# Patient Record
Sex: Male | Born: 1935 | Race: White | Hispanic: No | Smoking: Former smoker
Health system: Southern US, Community
[De-identification: ages and names within clinical notes are randomized; demographics above are authoritative.]

## PROBLEM LIST (undated history)

## (undated) DIAGNOSIS — Z8679 Personal history of other diseases of the circulatory system: Secondary | ICD-10-CM

## (undated) DIAGNOSIS — Z8619 Personal history of other infectious and parasitic diseases: Secondary | ICD-10-CM

## (undated) DIAGNOSIS — I4821 Permanent atrial fibrillation: Secondary | ICD-10-CM

## (undated) DIAGNOSIS — K219 Gastro-esophageal reflux disease without esophagitis: Secondary | ICD-10-CM

## (undated) DIAGNOSIS — D649 Anemia, unspecified: Secondary | ICD-10-CM

## (undated) DIAGNOSIS — I447 Left bundle-branch block, unspecified: Secondary | ICD-10-CM

## (undated) DIAGNOSIS — M064 Inflammatory polyarthropathy: Secondary | ICD-10-CM

## (undated) DIAGNOSIS — I10 Essential (primary) hypertension: Secondary | ICD-10-CM

## (undated) DIAGNOSIS — I38 Endocarditis, valve unspecified: Secondary | ICD-10-CM

## (undated) DIAGNOSIS — N189 Chronic kidney disease, unspecified: Secondary | ICD-10-CM

## (undated) DIAGNOSIS — I2510 Atherosclerotic heart disease of native coronary artery without angina pectoris: Secondary | ICD-10-CM

## (undated) DIAGNOSIS — C8510 Unspecified B-cell lymphoma, unspecified site: Secondary | ICD-10-CM

## (undated) DIAGNOSIS — I219 Acute myocardial infarction, unspecified: Secondary | ICD-10-CM

## (undated) DIAGNOSIS — E785 Hyperlipidemia, unspecified: Secondary | ICD-10-CM

## (undated) DIAGNOSIS — M0579 Rheumatoid arthritis with rheumatoid factor of multiple sites without organ or systems involvement: Secondary | ICD-10-CM

## (undated) HISTORY — DX: Rheumatoid arthritis with rheumatoid factor of multiple sites without organ or systems involvement: M05.79

## (undated) HISTORY — DX: Anemia, unspecified: D64.9

## (undated) HISTORY — DX: Atherosclerotic heart disease of native coronary artery without angina pectoris: I25.10

## (undated) HISTORY — DX: Personal history of other infectious and parasitic diseases: Z86.19

## (undated) HISTORY — PX: INGUINAL HERNIA REPAIR: SUR1180

## (undated) HISTORY — DX: Left bundle-branch block, unspecified: I44.7

## (undated) HISTORY — DX: Inflammatory polyarthropathy: M06.4

## (undated) HISTORY — DX: Hyperlipidemia, unspecified: E78.5

## (undated) HISTORY — DX: Essential (primary) hypertension: I10

## (undated) HISTORY — DX: Permanent atrial fibrillation: I48.21

## (undated) HISTORY — DX: Unspecified B-cell lymphoma, unspecified site: C85.10

## (undated) HISTORY — DX: Acute myocardial infarction, unspecified: I21.9

## (undated) HISTORY — DX: Gastro-esophageal reflux disease without esophagitis: K21.9

## (undated) HISTORY — DX: Endocarditis, valve unspecified: I38

## (undated) HISTORY — DX: Personal history of other diseases of the circulatory system: Z86.79

## (undated) HISTORY — DX: Chronic kidney disease, unspecified: N18.9

---

## 1993-01-20 DIAGNOSIS — I219 Acute myocardial infarction, unspecified: Secondary | ICD-10-CM

## 1993-01-20 HISTORY — PX: CORONARY ARTERY BYPASS GRAFT: SHX141

## 1993-01-20 HISTORY — DX: Acute myocardial infarction, unspecified: I21.9

## 2003-01-21 LAB — HM COLONOSCOPY

## 2013-11-20 DIAGNOSIS — M0579 Rheumatoid arthritis with rheumatoid factor of multiple sites without organ or systems involvement: Secondary | ICD-10-CM | POA: Insufficient documentation

## 2013-11-20 HISTORY — DX: Rheumatoid arthritis with rheumatoid factor of multiple sites without organ or systems involvement: M05.79

## 2015-04-30 LAB — HEPATIC FUNCTION PANEL
ALK PHOS: 45 U/L (ref 25–125)
ALT: 10 U/L (ref 10–40)
AST: 14 U/L (ref 14–40)
BILIRUBIN, TOTAL: 0.3 mg/dL

## 2015-04-30 LAB — BASIC METABOLIC PANEL
BUN: 65 mg/dL — AB (ref 4–21)
Creatinine: 3.7 mg/dL — AB (ref 0.6–1.3)
Glucose: 142 mg/dL
Potassium: 5.8 mmol/L — AB (ref 3.4–5.3)
SODIUM: 141 mmol/L (ref 137–147)

## 2015-04-30 LAB — CBC AND DIFFERENTIAL
HEMATOCRIT: 30 % — AB (ref 41–53)
HEMOGLOBIN: 9.4 g/dL — AB (ref 13.5–17.5)
PLATELETS: 264 10*3/uL (ref 150–399)
WBC: 10.2 10*3/mL

## 2015-04-30 LAB — POCT ERYTHROCYTE SEDIMENTATION RATE, NON-AUTOMATED: Sed Rate: 35 mm

## 2015-05-03 ENCOUNTER — Telehealth: Payer: Self-pay | Admitting: General Practice

## 2015-05-03 NOTE — Telephone Encounter (Signed)
Pt's daughter called in because she says that provider agreed to see her father. He is being referred to Dr. Larose Kells by current pt George Noble. Showing that provider doesn't have New Patient appt available until July. Pt has to have coumadin and would need an appt sooner.   Could provider advise for scheduling.   Thanks.    Daughter: Lianne Bushy [id number]

## 2015-05-06 NOTE — Telephone Encounter (Signed)
Please put two 15 minute appointments together and schedule a new pt appointment

## 2015-05-09 NOTE — Telephone Encounter (Signed)
Pt has been scheduled. Thanks!

## 2015-05-10 LAB — HEPATIC FUNCTION PANEL
ALK PHOS: 44 U/L (ref 25–125)
ALT: 8 U/L — AB (ref 10–40)
AST: 20 U/L (ref 14–40)
Bilirubin, Total: 0.5 mg/dL

## 2015-05-10 LAB — BASIC METABOLIC PANEL
BUN: 52 mg/dL — AB (ref 4–21)
CREATININE: 3.5 mg/dL — AB (ref 0.6–1.3)
Glucose: 108 mg/dL
POTASSIUM: 5.2 mmol/L (ref 3.4–5.3)
Sodium: 136 mmol/L — AB (ref 137–147)

## 2015-05-10 LAB — POCT ERYTHROCYTE SEDIMENTATION RATE, NON-AUTOMATED: Sed Rate: 46 mm

## 2015-05-10 LAB — CBC AND DIFFERENTIAL
HCT: 30 % — AB (ref 41–53)
HEMOGLOBIN: 9.6 g/dL — AB (ref 13.5–17.5)
Platelets: 192 10*3/uL (ref 150–399)
WBC: 7.6 10^3/mL

## 2015-06-01 ENCOUNTER — Encounter: Payer: Self-pay | Admitting: *Deleted

## 2015-06-01 ENCOUNTER — Telehealth: Payer: Self-pay | Admitting: *Deleted

## 2015-06-01 NOTE — Addendum Note (Signed)
Addended byDamita Dunnings D on: 06/01/2015 01:50 PM   Modules accepted: Medications

## 2015-06-01 NOTE — Telephone Encounter (Signed)
Received via fax and forwarded to Dr. Gaynelle Arabian. JG//CMA

## 2015-06-01 NOTE — Addendum Note (Signed)
Addended byDamita Dunnings D on: 06/01/2015 12:22 PM   Modules accepted: Medications

## 2015-06-04 ENCOUNTER — Other Ambulatory Visit: Payer: Self-pay

## 2015-06-04 ENCOUNTER — Telehealth: Payer: Self-pay | Admitting: Internal Medicine

## 2015-06-04 NOTE — Telephone Encounter (Signed)
Caller name: Lianne Bushy Relationship to patient: daughter Can be reached: [phone]  Reason for call: She called Dr. Darcella Cheshire office - they state medical records were mailed to Dr. Larose Kells on [place] 05/14/15. Have they been received? She is requesting f/u call.

## 2015-06-04 NOTE — Telephone Encounter (Signed)
Please refer to phone note from 06/01/15. Thanks, JG//CMA

## 2015-06-06 ENCOUNTER — Telehealth: Payer: Self-pay | Admitting: *Deleted

## 2015-06-06 NOTE — Telephone Encounter (Signed)
Unable to reach patient at time of pre-visit call. Left message for patient to return call when available.  

## 2015-06-06 NOTE — Telephone Encounter (Signed)
No other records have been received.

## 2015-06-06 NOTE — Telephone Encounter (Signed)
Daughter George Noble) is interested in knowing if the records from Dr. Darcella Cheshire office has been received. The records from your note on 06/01/15 shows records received from his Rheumatologist not his PCP. Please inform daughter if other records have been received. [phone] is the number she can be reached out.

## 2015-06-07 ENCOUNTER — Telehealth: Payer: Self-pay

## 2015-06-07 ENCOUNTER — Ambulatory Visit (HOSPITAL_BASED_OUTPATIENT_CLINIC_OR_DEPARTMENT_OTHER)
Admission: RE | Admit: 2015-06-07 | Discharge: 2015-06-07 | Disposition: A | Discharge status: Home / Self Care | Payer: Medicare Other | Source: Ambulatory Visit | Attending: Internal Medicine | Admitting: Internal Medicine

## 2015-06-07 ENCOUNTER — Encounter: Payer: Self-pay | Admitting: Internal Medicine

## 2015-06-07 ENCOUNTER — Ambulatory Visit (INDEPENDENT_AMBULATORY_CARE_PROVIDER_SITE_OTHER): Payer: Medicare Other | Admitting: Internal Medicine

## 2015-06-07 VITALS — BP 152/68 | HR 61 | Temp 98.1°F | Ht 72.0 in | Wt 160.2 lb

## 2015-06-07 DIAGNOSIS — T148 Other injury of unspecified body region: Secondary | ICD-10-CM | POA: Diagnosis not present

## 2015-06-07 DIAGNOSIS — M0579 Rheumatoid arthritis with rheumatoid factor of multiple sites without organ or systems involvement: Secondary | ICD-10-CM | POA: Diagnosis not present

## 2015-06-07 DIAGNOSIS — E785 Hyperlipidemia, unspecified: Secondary | ICD-10-CM

## 2015-06-07 DIAGNOSIS — C8580 Other specified types of non-Hodgkin lymphoma, unspecified site: Secondary | ICD-10-CM

## 2015-06-07 DIAGNOSIS — T148XXA Other injury of unspecified body region, initial encounter: Secondary | ICD-10-CM

## 2015-06-07 DIAGNOSIS — M438X4 Other specified deforming dorsopathies, thoracic region: Secondary | ICD-10-CM | POA: Insufficient documentation

## 2015-06-07 DIAGNOSIS — S20219A Contusion of unspecified front wall of thorax, initial encounter: Secondary | ICD-10-CM | POA: Diagnosis present

## 2015-06-07 DIAGNOSIS — N189 Chronic kidney disease, unspecified: Secondary | ICD-10-CM

## 2015-06-07 DIAGNOSIS — D509 Iron deficiency anemia, unspecified: Secondary | ICD-10-CM

## 2015-06-07 DIAGNOSIS — I10 Essential (primary) hypertension: Secondary | ICD-10-CM

## 2015-06-07 DIAGNOSIS — Z09 Encounter for follow-up examination after completed treatment for conditions other than malignant neoplasm: Secondary | ICD-10-CM

## 2015-06-07 DIAGNOSIS — N184 Chronic kidney disease, stage 4 (severe): Secondary | ICD-10-CM | POA: Diagnosis not present

## 2015-06-07 DIAGNOSIS — I482 Chronic atrial fibrillation: Secondary | ICD-10-CM | POA: Diagnosis not present

## 2015-06-07 DIAGNOSIS — X58XXXA Exposure to other specified factors, initial encounter: Secondary | ICD-10-CM | POA: Insufficient documentation

## 2015-06-07 DIAGNOSIS — I4821 Permanent atrial fibrillation: Secondary | ICD-10-CM

## 2015-06-07 DIAGNOSIS — D649 Anemia, unspecified: Secondary | ICD-10-CM

## 2015-06-07 LAB — CBC WITH DIFFERENTIAL/PLATELET
BASOS PCT: 0.3 % (ref 0.0–3.0)
Basophils Absolute: 0 10*3/uL (ref 0.0–0.1)
EOS ABS: 0.1 10*3/uL (ref 0.0–0.7)
Eosinophils Relative: 1 % (ref 0.0–5.0)
HEMATOCRIT: 29 % — AB (ref 39.0–52.0)
HEMOGLOBIN: 9.5 g/dL — AB (ref 13.0–17.0)
Lymphs Abs: 0.4 10*3/uL — ABNORMAL LOW (ref 0.7–4.0)
MCHC: 32.7 g/dL (ref 30.0–36.0)
MCV: 92.4 fl (ref 78.0–100.0)
MONO ABS: 0.7 10*3/uL (ref 0.1–1.0)
Monocytes Relative: 7.2 % (ref 3.0–12.0)
Neutro Abs: 8.6 10*3/uL — ABNORMAL HIGH (ref 1.4–7.7)
Neutrophils Relative %: 87.2 % — ABNORMAL HIGH (ref 43.0–77.0)
Platelets: 176 10*3/uL (ref 150.0–400.0)
RBC: 3.14 Mil/uL — AB (ref 4.22–5.81)
RDW: 16.5 % — ABNORMAL HIGH (ref 11.5–15.5)
WBC: 9.8 10*3/uL (ref 4.0–10.5)

## 2015-06-07 LAB — COMPREHENSIVE METABOLIC PANEL
ALBUMIN: 3.2 g/dL — AB (ref 3.5–5.2)
ALK PHOS: 35 U/L — AB (ref 39–117)
ALT: 13 U/L (ref 0–53)
AST: 14 U/L (ref 0–37)
BUN: 67 mg/dL — AB (ref 6–23)
CALCIUM: 8.3 mg/dL — AB (ref 8.4–10.5)
CO2: 17 mEq/L — ABNORMAL LOW (ref 19–32)
CREATININE: 3.72 mg/dL — AB (ref 0.40–1.50)
Chloride: 113 mEq/L — ABNORMAL HIGH (ref 96–112)
GFR: 16.44 mL/min — ABNORMAL LOW (ref >60.00)
Glucose, Bld: 151 mg/dL — ABNORMAL HIGH (ref 70–99)
Potassium: 4.9 mEq/L (ref 3.5–5.1)
Sodium: 139 mEq/L (ref 135–145)
TOTAL PROTEIN: 5.7 g/dL — AB (ref 6.0–8.3)
Total Bilirubin: 0.7 mg/dL (ref 0.2–1.2)

## 2015-06-07 LAB — POCT INR: INR: 3.8

## 2015-06-07 NOTE — Telephone Encounter (Signed)
Received fax confirmation on 06/07/2015 1307.

## 2015-06-07 NOTE — Telephone Encounter (Signed)
ROI faxed to Dr. Stacy Gardner in [place] at [phone], form sent for scanning.

## 2015-06-07 NOTE — Progress Notes (Signed)
Pre visit review using our clinic review tool, if applicable. No additional management support is needed unless otherwise documented below in the visit note. 

## 2015-06-07 NOTE — Progress Notes (Signed)
Subjective:    Patient ID: George Noble, male    DOB: [birth year], 82 y.o.   MRN: [id number]  DOS:  06/07/2015 Type of visit - description : new pt, here w/ his daughter George Noble Interval history: Pt just moved from [place] to live w/ his daughter , Here to get established. A number of records were reviewed. He was actually doing very well until 3 days ago, he had an accidental fall, landed on his back. Since then is having a significant amount of pain mostly at the left posterior thorax. BP was noted to be elevated on and off at home. BP today also slightly elevated. Due to check an INR   Lexiscan 2011: Very small defect in the anterior septum, likely soft tissue attenuation otherwise negative Echocardiogram 11-11- 2015: Normal LV function EF 60% mild to moderate AoV calcification with mild regurgitation , moderately elevated pulmonary artery systolic pressure >>>see full report  Labs from 2015 Creatinine 2.6, white count 5.8, hemoglobin 10.1, platelets 176  Labs 12/03/2014 White count 8.5, hemoglobin 10.7, platelets 225. Potassium 4.2, creatinine 3.13, phosphorus 3.7. Calcium 8.2  Labs 02/16/2015: White count 10.0, platelets 280, hemoglobin 7.6 Sodium 141, potassium 5.3, creatinine 3.66. Calcium 8.0 slightly low. AST ALT normal. Vitamin D 16: Low. PTH 291, high--- was rx Rocaltrol  Labs 4-10- 2017 Hemoglobin 9.4, white count 10.9, platelets 264, potassium 5.8, creatinine 3.6, LFTs normal CRP 0 .81 DB: Indeterminate.  Note from rheumatology 05/01/2015:  RA initially managed with Arava and low-dose prednisone, Arava d/c after he developed lymphoma. no MTX d/t CKD and  concomitant lymphoma. Rx Rituximab 06-2014, second dose received 1- 2017. Seen 04/10/2015, very active disease, started [facility] 05/10/2015 Hemoglobin 9.6, white count 7.5, platelets 192. Sedimentation rate 46, CRP 2.8 Creatinine 3.48, potassium 5.2, AST ALT normal, alkaline phosphatase 44  TB gold negative  05/10/2015   Review of Systems Denies SS chest pain, difficulty breathing. Does have lower extremity edema, slightly worse than baseline?Marland Kitchen No neck pain per se. No upper or lower extremities paresthesias.  Past Medical History  Diagnosis Date  . Seropositive rheumatoid arthritis of multiple sites ([place]) 11/2013  . Inflammatory polyarthropathy ([place])   . Low grade B-cell lymphoma ([place])   . CAD (coronary artery disease)   . Myocardial infarction [facility]) 1995    inferior, multivessel CABG  . H/O abdominal aortic aneurysm   . Atrial fibrillation, permanent ([place])   . Hyperlipidemia   . Valvular insufficiency   . Complete left bundle branch block (LBBB)     on EKG  . GERD (gastroesophageal reflux disease)   . Anemia   . Hypertension   . History of chicken pox   . Chronic kidney disease     Past Surgical History  Procedure Laterality Date  . Coronary artery bypass graft  1995    multivessel    Social History   Social History  . Marital Status: Widowed    Spouse Name: N/A  . Number of Children: 3  . Years of Education: N/A   Occupational History  . retired    Social History Main Topics  . Smoking status: Former Research scientist (life sciences)  . Smokeless tobacco: Not on file     Comment: quit remotely   . Alcohol Use: 0.0 oz/week    0 Standard drinks or equivalent per week     Comment: not daily   . Drug Use: No  . Sexual Activity: Not on file   Other Topics Concern  . Not on  file   Social History Narrative   Moved from [place], [place] 04-2015 to live w/ George Noble, daughter   Son in law [name]    History reviewed. No pertinent family history.     Medication List       This list is accurate as of: 06/07/15 11:59 PM.  Always use your most recent med list.               amiodarone 200 MG tablet  Commonly known as:  PACERONE  Take 200 mg by mouth daily.     amLODipine 5 MG tablet  Commonly known as:  NORVASC  Take 5 mg by mouth daily.     atorvastatin 20 MG tablet  Commonly  known as:  LIPITOR  Take 20 mg by mouth daily.     calcitRIOL 0.25 MCG capsule  Commonly known as:  ROCALTROL  Take 0.25 mcg by mouth daily.     ERGOCALCIFEROL PO  Take 1.25 mg by mouth once a week.     isosorbide mononitrate 30 MG 24 hr tablet  Commonly known as:  IMDUR  Take 30 mg by mouth daily.     metoprolol succinate 25 MG 24 hr tablet  Commonly known as:  TOPROL-XL  Take 25 mg by mouth daily.     omeprazole 20 MG capsule  Commonly known as:  PRILOSEC  Take 20 mg by mouth daily.     oxybutynin 10 MG 24 hr tablet  Commonly known as:  DITROPAN-XL  Take 10 mg by mouth at bedtime.     oxyCODONE-acetaminophen 5-325 MG tablet  Commonly known as:  PERCOCET/ROXICET  Take 1 tablet by mouth every 6 (six) hours as needed. Max daily amount 4 tablets.     predniSONE 10 MG tablet  Commonly known as:  DELTASONE  Take 10 mg by mouth daily with breakfast.     PRESERVISION AREDS 2 Caps  Take by mouth daily. Reported on 06/07/2015     tamsulosin 0.4 MG Caps capsule  Commonly known as:  FLOMAX  Take 0.4 mg by mouth daily.     warfarin 3 MG tablet  Commonly known as:  COUMADIN  Take 3 mg by mouth daily. Monday and Thursday 1/2 tablet     XELJANZ 5 MG Tabs  Generic drug:  Tofacitinib Citrate  Take 1 tablet by mouth daily.           Objective:   Physical Exam  Musculoskeletal:       Arms:  BP 152/68 mmHg  Pulse 61  Temp(Src) 98.1 F (36.7 C) (Oral)  Ht 6' (1.829 m)  Wt 160 lb 4 oz (72.689 kg)  BMI 21.73 kg/m2  SpO2 93% General:   Well developed, slt underweight appearing. ++ distress when change positions (d/t chest wall pain).  HEENT:  Normocephalic . Face symmetric, atraumatic. Lungs:  CTA B Normal respiratory effort, no intercostal retractions, no accessory muscle use. Heart: RRR,  Soft syst  murmur.  +/+++  pretibial edema bilaterally  Abdomen:  Not distended, soft, non-tender. No rebound or rigidity. No bruit    MSK: No actual TTP at the cervical  thoracic spine Skin: Not pale. Not jaundice Neurologic:  alert & oriented X3.  Speech normal, gait appropriate for age , assisted by a walker  Psych--  Cognition and judgment appear intact.  Cooperative with normal attention span and concentration.  Behavior appropriate. No anxious or depressed appearing.     Assessment & Plan:   Assessment HTN  Hyperlipidemia CKD - offered HD, declined as off 05-2015 Rheumatoid arthritis -- xeljanz, prednisone, oxycodone  HOH CV: --CAD, [place]-CABG 1995 --Abdominal aortic aneurysm --Atrial fibrillation -on Coumadin, amiodarone --Complete LBBB H/o LOW GRADE B  lymphoma -- dx ~ 2015 (?), no treatment recommended  Anemia-- Per nephrology note 2014 with low iron stores. was rx transfusions prn  Best contact--------------> George Noble, daughter : [phone]   PLAN: Recent fall, severe L posterior chest wall pain w/ movments. rec tylenol , oxycodone, see AVS.  Addendum--- Multiple rib fractures, see phone notes. HTN: BP slt elevated today, rec to check ABP.  CKD: labs , refer to renal ASAP. Was recommended a fistula in preparation to HD but so far has declined  R.A. -- refer to rheumatology ASAP CAD, Afib: needs to see a local  cardiologist, referral.  INR: 3.8 , current coumadin 3 mg qd, 1.5 mg Mon-Thurs, will recheck w/ a venous stick -- HOLD 2 doses , new 3 mg qd, M W F 1.5 , recheck 10 days , George Noble verbalized understanding H/o lymphoma: was not recommended treatment, wait for records, labs H/o Anemia: was rx a transfusion (?), prn -- refer to hematology RTC 10 days for INR  Today, I spent more than 70   min with the patient: >50% of the time counseling regards acute problem (rib fractures) including pain management,reviewing the chart and labs ordered by other providers , coordinating his care with multiple specialists referrals

## 2015-06-07 NOTE — Patient Instructions (Addendum)
GO TO THE LAB : Get the blood work     GO TO THE FRONT DESK Schedule your next appointment for a  Coumadin check in 10 days     STOP BY THE FIRST FLOOR:  get the XR    Tylenol  500 mg OTC 2 tabs a day every 8 hours as needed for pain If pain is severe : oxycodone instead, watch for drowsiness      Check the  blood pressure  daily Be sure your blood pressure is between 110/65 and  145/85. If it is consistently higher or lower, let me know

## 2015-06-08 ENCOUNTER — Telehealth: Payer: Self-pay | Admitting: Internal Medicine

## 2015-06-08 MED ORDER — TRAMADOL HCL 50 MG PO TABS: 25.0000 mg | ORAL_TABLET | Freq: Two times a day (BID) | ORAL | Status: AC | PRN

## 2015-06-08 NOTE — Telephone Encounter (Signed)
[facility]adol [facility] [address].

## 2015-06-08 NOTE — Telephone Encounter (Signed)
XRs discussed, d/c oxycodone, try tylenol and ultram 25-50 mg bid prn, watch for somnolence

## 2015-06-08 NOTE — Telephone Encounter (Signed)
Pt daughter [name] [id number]    She called in because she is returning a vm from PCP. Pt says that provider mention pt having osteoporosis. She says that she wasn't aware of that and would like to be advised by provider going forward. She also says that pt pain medication is making him nauseous she would like to know if provider could switch pain medication ?

## 2015-06-08 NOTE — Telephone Encounter (Signed)
Called [place], no answer. Asked for call back Plan will be to prescribe tramadol 50 mg twice a day instead of oxycodone.

## 2015-06-10 DIAGNOSIS — D509 Iron deficiency anemia, unspecified: Secondary | ICD-10-CM | POA: Insufficient documentation

## 2015-06-10 DIAGNOSIS — C8590 Non-Hodgkin lymphoma, unspecified, unspecified site: Secondary | ICD-10-CM | POA: Insufficient documentation

## 2015-06-10 DIAGNOSIS — Z09 Encounter for follow-up examination after completed treatment for conditions other than malignant neoplasm: Secondary | ICD-10-CM | POA: Insufficient documentation

## 2015-06-10 DIAGNOSIS — I10 Essential (primary) hypertension: Secondary | ICD-10-CM | POA: Insufficient documentation

## 2015-06-10 NOTE — Assessment & Plan Note (Signed)
Recent fall, severe L posterior chest wall pain w/ movments. rec tylenol , oxycodone, see AVS.  Addendum--- Multiple rib fractures, see phone notes. HTN: BP slt elevated today, rec to check ABP.  CKD: labs , refer to renal ASAP. Was recommended a fistula in preparation to HD but so far has declined  R.A. -- refer to rheumatology ASAP CAD, Afib: needs to see a local  cardiologist, referral.  INR: 3.8 , current coumadin 3 mg qd, 1.5 mg Mon-Thurs, will recheck w/ a venous stick -- HOLD 2 doses , new 3 mg qd, M W F 1.5 , recheck 10 days , George Noble verbalized understanding H/o lymphoma: was not recommended treatment, wait for records, labs H/o Anemia: was rx a transfusion (?), prn -- refer to hematology RTC 10 days for INR

## 2015-06-11 ENCOUNTER — Encounter: Payer: Self-pay | Admitting: Internal Medicine

## 2015-06-13 ENCOUNTER — Encounter: Payer: Self-pay | Admitting: Internal Medicine

## 2015-06-14 ENCOUNTER — Telehealth: Payer: Self-pay

## 2015-06-14 NOTE — Telephone Encounter (Signed)
Called and spoke w/ Dr. Demaris Callander office regarding medical records requested by Pt's family last month to be sent to [place]. Informed that medical records are processed by an outside agency, [facility]. Was given Log ID: [id number] and to call [phone]. After waiting >10 minutes, informed that a denial letter was mailed on 05/14/2015 and received by local post office (unsure where letter was sent, either to family or [place]) on 05/21/2015; medical records denied because ROI was not completed by family correctly. Informed they would try to mail that letter to [place] again. Will inform Pt's daughter, [name], that she will need to return to office to have Pt sign another ROI to fax to Dr. Judeth Porch office.

## 2015-06-14 NOTE — Telephone Encounter (Signed)
Received medical records from Dr. Lolly Mustache in [place]. Forwarded to Dr. Larose Kells for review.

## 2015-06-15 ENCOUNTER — Telehealth: Payer: Self-pay | Admitting: *Deleted

## 2015-06-15 ENCOUNTER — Encounter: Payer: Self-pay | Admitting: Internal Medicine

## 2015-06-15 NOTE — Telephone Encounter (Signed)
Received via mail and forwarded to Dr. Gaynelle Arabian. JG//CMA

## 2015-06-19 ENCOUNTER — Ambulatory Visit (INDEPENDENT_AMBULATORY_CARE_PROVIDER_SITE_OTHER): Payer: Medicare Other | Admitting: Behavioral Health

## 2015-06-19 ENCOUNTER — Encounter: Payer: Self-pay | Admitting: Internal Medicine

## 2015-06-19 DIAGNOSIS — I482 Chronic atrial fibrillation: Secondary | ICD-10-CM

## 2015-06-19 DIAGNOSIS — I4821 Permanent atrial fibrillation: Secondary | ICD-10-CM

## 2015-06-19 LAB — POCT INR: INR: 4.1

## 2015-06-19 NOTE — Progress Notes (Signed)
Pre visit review using our clinic review tool, if applicable. No additional management support is needed unless otherwise documented below in the visit note.  Patient presents in clinic today with his daughter for INR check. Reading was 4.1. Patient did not report any positive findings, changes in diet or medications. Per the daughter, the patient is consistent with his greens intake and mainly eats salads.  Per Dr. Charlett Blake: Hold Coumadin for 2 days and recheck INR on Thursday. Informed patient and his daughter of the provider's instructions. Both individuals voiced understanding and did not have any questions or concerns prior to leaving the office.  Next appointment scheduled for 06/21/15 at 3:00 PM.

## 2015-06-19 NOTE — Patient Instructions (Addendum)
Per Dr. Charlett Blake: Hold Coumadin for 2 days and recheck INR on Thursday.

## 2015-06-20 ENCOUNTER — Telehealth: Payer: Self-pay | Admitting: *Deleted

## 2015-06-20 ENCOUNTER — Encounter: Payer: Self-pay | Admitting: Internal Medicine

## 2015-06-20 ENCOUNTER — Telehealth: Payer: Self-pay | Admitting: Internal Medicine

## 2015-06-20 MED ORDER — OMEPRAZOLE 20 MG PO CPDR: 20.0000 mg | DELAYED_RELEASE_CAPSULE | Freq: Every day | ORAL | Status: AC

## 2015-06-20 MED ORDER — CALCITRIOL 0.25 MCG PO CAPS: 0.2500 ug | ORAL_CAPSULE | Freq: Every day | ORAL | Status: AC

## 2015-06-20 NOTE — Telephone Encounter (Signed)
Forwarded to [employer]. JG//CMA

## 2015-06-20 NOTE — Telephone Encounter (Signed)
Faxed Medical Records Requests   Dr. Erik Obey, Fax# [phone], conf that fax went thru ok 06/20/15 8:10am Dr. Stacy Gardner, Fax# [phone], conf that fax went thru ok 06/20/15 8:11am Dr. Debbe Bales, Fax# [phone], conf that fax went thru ok 06/20/15 8:15am Dr. Lucita Ferrara, Fax# [phone], conf that fax went thru ok 06/20/15 8:16am

## 2015-06-20 NOTE — Telephone Encounter (Signed)
[facility]

## 2015-06-21 ENCOUNTER — Telehealth: Payer: Self-pay

## 2015-06-21 ENCOUNTER — Ambulatory Visit: Payer: Medicare Other | Admitting: Behavioral Health

## 2015-06-21 DIAGNOSIS — S2242XA Multiple fractures of ribs, left side, initial encounter for closed fracture: Secondary | ICD-10-CM

## 2015-06-21 DIAGNOSIS — I4821 Permanent atrial fibrillation: Secondary | ICD-10-CM

## 2015-06-21 LAB — POCT INR: INR: 3.8

## 2015-06-21 MED ORDER — OXYCODONE-ACETAMINOPHEN 7.5-325 MG PO TABS: 1.0000 | ORAL_TABLET | Freq: Three times a day (TID) | ORAL | Status: AC | PRN

## 2015-06-21 NOTE — Patient Instructions (Addendum)
Per Dr. Larose Kells: Hold Coumadin for 2 days & then take Coumdin 1.5 mg daily. Return in 10 days for INR check.  Stop tramadol  Try Percocet 7.5 mg, it is stronger, okay to take it 3 times a day. Watch for excessive somnolence  Will refer you to pain management  Come back in 10 days for a Coumadin check

## 2015-06-21 NOTE — Telephone Encounter (Signed)
Received fax confirmation 06/21/2015 at 1631.

## 2015-06-21 NOTE — Telephone Encounter (Signed)
Home Health orders and hospital bed orders faxed to [facility] at [phone].

## 2015-06-21 NOTE — Telephone Encounter (Signed)
Home Health order form sent for scanning.

## 2015-06-21 NOTE — Progress Notes (Signed)
Pre visit review using our clinic review tool, if applicable. No additional management support is needed unless otherwise documented below in the visit note.  Patient in office for INR check, accompanied by his daughter. Today's reading was 3.8. He did not report any positive patient findings. Per Dr. Larose Kells: Hold Coumadin for 2 days & then take Coumdin 1.5 mg daily. Return in 10 days for INR check. Informed the patient and his daughter of the provider's instructions and they both voiced understanding.   Also, during the visit the patient's daughter requested a different medication or change in dosage of her father's current pain medication, as well as a referral for home health & a hospital bed. She voiced that since her father's recent fall, he's been unable to lay flat, rise or sit up in bed due the pain from the fractures. The daughter mentioned as well that he's needing more assistance with activities of daily living (e.g. bathing, grooming and etc.).  Referrals have been placed for pain management, home health and hospital bed.  Next appointment scheduled for 06/29/15 at 3:45 PM.

## 2015-06-22 LAB — BASIC METABOLIC PANEL
BUN: 68 mg/dL — AB (ref 4–21)
Creatinine: 3.8 mg/dL — AB (ref <=1.3)
Glucose: 112 mg/dL
Potassium: 5 mmol/L (ref 3.4–5.3)
SODIUM: 141 mmol/L (ref 137–147)

## 2015-06-22 NOTE — Telephone Encounter (Signed)
George Noble from [facility] is requesting a call back regarding hospital bed at [phone]

## 2015-06-22 NOTE — Telephone Encounter (Signed)
Spoke w/ Melissa, from [facility], [place] has been approved and she informed me that she didn't believe [facility] was in network for [employer] for DME orders, however, Pt does have secondary Medicare and she is processing hospital bed order through Medicare now. She will call if she needs anything further.

## 2015-06-25 ENCOUNTER — Telehealth: Payer: Self-pay | Admitting: Internal Medicine

## 2015-06-25 ENCOUNTER — Encounter (HOSPITAL_BASED_OUTPATIENT_CLINIC_OR_DEPARTMENT_OTHER): Payer: Self-pay | Admitting: *Deleted

## 2015-06-25 ENCOUNTER — Other Ambulatory Visit: Payer: BLUE CROSS/BLUE SHIELD

## 2015-06-25 ENCOUNTER — Emergency Department (HOSPITAL_BASED_OUTPATIENT_CLINIC_OR_DEPARTMENT_OTHER): Payer: Medicare Other

## 2015-06-25 ENCOUNTER — Ambulatory Visit: Payer: BLUE CROSS/BLUE SHIELD | Admitting: Hematology & Oncology

## 2015-06-25 ENCOUNTER — Telehealth: Payer: Self-pay | Admitting: Hematology & Oncology

## 2015-06-25 ENCOUNTER — Inpatient Hospital Stay (HOSPITAL_BASED_OUTPATIENT_CLINIC_OR_DEPARTMENT_OTHER)
Admission: EM | Admit: 2015-06-25 | Discharge: 2015-07-21 | DRG: 177 | Disposition: E | Payer: Medicare Other | Attending: Internal Medicine | Admitting: Internal Medicine

## 2015-06-25 ENCOUNTER — Ambulatory Visit: Payer: BLUE CROSS/BLUE SHIELD

## 2015-06-25 DIAGNOSIS — I509 Heart failure, unspecified: Secondary | ICD-10-CM | POA: Diagnosis not present

## 2015-06-25 DIAGNOSIS — L89152 Pressure ulcer of sacral region, stage 2: Secondary | ICD-10-CM | POA: Diagnosis present

## 2015-06-25 DIAGNOSIS — D631 Anemia in chronic kidney disease: Secondary | ICD-10-CM | POA: Diagnosis present

## 2015-06-25 DIAGNOSIS — Z7952 Long term (current) use of systemic steroids: Secondary | ICD-10-CM | POA: Diagnosis not present

## 2015-06-25 DIAGNOSIS — I129 Hypertensive chronic kidney disease with stage 1 through stage 4 chronic kidney disease, or unspecified chronic kidney disease: Secondary | ICD-10-CM | POA: Diagnosis present

## 2015-06-25 DIAGNOSIS — N2581 Secondary hyperparathyroidism of renal origin: Secondary | ICD-10-CM | POA: Diagnosis present

## 2015-06-25 DIAGNOSIS — D509 Iron deficiency anemia, unspecified: Secondary | ICD-10-CM | POA: Diagnosis present

## 2015-06-25 DIAGNOSIS — E860 Dehydration: Secondary | ICD-10-CM | POA: Diagnosis present

## 2015-06-25 DIAGNOSIS — W19XXXD Unspecified fall, subsequent encounter: Secondary | ICD-10-CM | POA: Diagnosis present

## 2015-06-25 DIAGNOSIS — Z8572 Personal history of non-Hodgkin lymphomas: Secondary | ICD-10-CM

## 2015-06-25 DIAGNOSIS — M059 Rheumatoid arthritis with rheumatoid factor, unspecified: Secondary | ICD-10-CM | POA: Diagnosis present

## 2015-06-25 DIAGNOSIS — N390 Urinary tract infection, site not specified: Secondary | ICD-10-CM | POA: Diagnosis present

## 2015-06-25 DIAGNOSIS — J690 Pneumonitis due to inhalation of food and vomit: Secondary | ICD-10-CM | POA: Diagnosis present

## 2015-06-25 DIAGNOSIS — S2242XD Multiple fractures of ribs, left side, subsequent encounter for fracture with routine healing: Secondary | ICD-10-CM | POA: Diagnosis not present

## 2015-06-25 DIAGNOSIS — N189 Chronic kidney disease, unspecified: Secondary | ICD-10-CM | POA: Diagnosis not present

## 2015-06-25 DIAGNOSIS — N401 Enlarged prostate with lower urinary tract symptoms: Secondary | ICD-10-CM | POA: Diagnosis present

## 2015-06-25 DIAGNOSIS — I4821 Permanent atrial fibrillation: Secondary | ICD-10-CM | POA: Diagnosis present

## 2015-06-25 DIAGNOSIS — Z6821 Body mass index (BMI) 21.0-21.9, adult: Secondary | ICD-10-CM

## 2015-06-25 DIAGNOSIS — IMO0002 Reserved for concepts with insufficient information to code with codable children: Secondary | ICD-10-CM

## 2015-06-25 DIAGNOSIS — Z951 Presence of aortocoronary bypass graft: Secondary | ICD-10-CM

## 2015-06-25 DIAGNOSIS — R0602 Shortness of breath: Secondary | ICD-10-CM | POA: Diagnosis not present

## 2015-06-25 DIAGNOSIS — Q613 Polycystic kidney, unspecified: Secondary | ICD-10-CM | POA: Diagnosis not present

## 2015-06-25 DIAGNOSIS — E872 Acidosis: Secondary | ICD-10-CM | POA: Diagnosis present

## 2015-06-25 DIAGNOSIS — E785 Hyperlipidemia, unspecified: Secondary | ICD-10-CM | POA: Diagnosis present

## 2015-06-25 DIAGNOSIS — Z87891 Personal history of nicotine dependence: Secondary | ICD-10-CM

## 2015-06-25 DIAGNOSIS — Z880 Allergy status to penicillin: Secondary | ICD-10-CM | POA: Diagnosis not present

## 2015-06-25 DIAGNOSIS — N179 Acute kidney failure, unspecified: Secondary | ICD-10-CM | POA: Diagnosis present

## 2015-06-25 DIAGNOSIS — S32019D Unspecified fracture of first lumbar vertebra, subsequent encounter for fracture with routine healing: Secondary | ICD-10-CM

## 2015-06-25 DIAGNOSIS — I2510 Atherosclerotic heart disease of native coronary artery without angina pectoris: Secondary | ICD-10-CM | POA: Diagnosis present

## 2015-06-25 DIAGNOSIS — N19 Unspecified kidney failure: Secondary | ICD-10-CM

## 2015-06-25 DIAGNOSIS — G9341 Metabolic encephalopathy: Secondary | ICD-10-CM | POA: Diagnosis present

## 2015-06-25 DIAGNOSIS — J9601 Acute respiratory failure with hypoxia: Secondary | ICD-10-CM | POA: Diagnosis present

## 2015-06-25 DIAGNOSIS — E875 Hyperkalemia: Secondary | ICD-10-CM | POA: Diagnosis present

## 2015-06-25 DIAGNOSIS — S22089D Unspecified fracture of T11-T12 vertebra, subsequent encounter for fracture with routine healing: Secondary | ICD-10-CM

## 2015-06-25 DIAGNOSIS — R338 Other retention of urine: Secondary | ICD-10-CM | POA: Diagnosis present

## 2015-06-25 DIAGNOSIS — I252 Old myocardial infarction: Secondary | ICD-10-CM

## 2015-06-25 DIAGNOSIS — G9340 Encephalopathy, unspecified: Secondary | ICD-10-CM | POA: Diagnosis present

## 2015-06-25 DIAGNOSIS — K5900 Constipation, unspecified: Secondary | ICD-10-CM | POA: Diagnosis present

## 2015-06-25 DIAGNOSIS — I447 Left bundle-branch block, unspecified: Secondary | ICD-10-CM | POA: Diagnosis present

## 2015-06-25 DIAGNOSIS — R1084 Generalized abdominal pain: Secondary | ICD-10-CM | POA: Insufficient documentation

## 2015-06-25 DIAGNOSIS — N185 Chronic kidney disease, stage 5: Secondary | ICD-10-CM | POA: Diagnosis present

## 2015-06-25 DIAGNOSIS — K219 Gastro-esophageal reflux disease without esophagitis: Secondary | ICD-10-CM | POA: Diagnosis present

## 2015-06-25 DIAGNOSIS — R4182 Altered mental status, unspecified: Secondary | ICD-10-CM | POA: Diagnosis present

## 2015-06-25 DIAGNOSIS — L8990 Pressure ulcer of unspecified site, unspecified stage: Secondary | ICD-10-CM | POA: Insufficient documentation

## 2015-06-25 DIAGNOSIS — Z66 Do not resuscitate: Secondary | ICD-10-CM | POA: Diagnosis present

## 2015-06-25 DIAGNOSIS — Z515 Encounter for palliative care: Secondary | ICD-10-CM | POA: Diagnosis present

## 2015-06-25 DIAGNOSIS — Z79899 Other long term (current) drug therapy: Secondary | ICD-10-CM

## 2015-06-25 DIAGNOSIS — I10 Essential (primary) hypertension: Secondary | ICD-10-CM | POA: Diagnosis present

## 2015-06-25 DIAGNOSIS — M0579 Rheumatoid arthritis with rheumatoid factor of multiple sites without organ or systems involvement: Secondary | ICD-10-CM | POA: Diagnosis present

## 2015-06-25 DIAGNOSIS — J189 Pneumonia, unspecified organism: Secondary | ICD-10-CM

## 2015-06-25 DIAGNOSIS — Z91010 Allergy to peanuts: Secondary | ICD-10-CM | POA: Diagnosis not present

## 2015-06-25 DIAGNOSIS — Z7901 Long term (current) use of anticoagulants: Secondary | ICD-10-CM

## 2015-06-25 DIAGNOSIS — E43 Unspecified severe protein-calorie malnutrition: Secondary | ICD-10-CM | POA: Diagnosis present

## 2015-06-25 DIAGNOSIS — I482 Chronic atrial fibrillation: Secondary | ICD-10-CM | POA: Diagnosis present

## 2015-06-25 DIAGNOSIS — I5041 Acute combined systolic (congestive) and diastolic (congestive) heart failure: Secondary | ICD-10-CM | POA: Diagnosis present

## 2015-06-25 DIAGNOSIS — Z882 Allergy status to sulfonamides status: Secondary | ICD-10-CM | POA: Diagnosis not present

## 2015-06-25 LAB — URINALYSIS, ROUTINE W REFLEX MICROSCOPIC
Bilirubin Urine: NEGATIVE
GLUCOSE, UA: NEGATIVE mg/dL
Ketones, ur: NEGATIVE mg/dL
NITRITE: NEGATIVE
PH: 8 (ref 5.0–8.0)
Specific Gravity, Urine: 1.014 (ref 1.005–1.030)

## 2015-06-25 LAB — COMPREHENSIVE METABOLIC PANEL
ALBUMIN: 2.8 g/dL — AB (ref 3.5–5.0)
ALT: 17 U/L (ref 17–63)
ANION GAP: 11 (ref 5–15)
AST: 22 U/L (ref 15–41)
Alkaline Phosphatase: 45 U/L (ref 38–126)
BILIRUBIN TOTAL: 1 mg/dL (ref 0.3–1.2)
BUN: 66 mg/dL — AB (ref 6–20)
CHLORIDE: 109 mmol/L (ref 101–111)
CO2: 15 mmol/L — ABNORMAL LOW (ref 22–32)
Calcium: 8 mg/dL — ABNORMAL LOW (ref 8.9–10.3)
Creatinine, Ser: 4.23 mg/dL — ABNORMAL HIGH (ref 0.61–1.24)
GFR calc Af Amer: 13 mL/min — ABNORMAL LOW (ref >60)
GFR, EST NON AFRICAN AMERICAN: 11 mL/min — AB (ref >60)
Glucose, Bld: 110 mg/dL — ABNORMAL HIGH (ref 65–99)
POTASSIUM: 5.3 mmol/L — AB (ref 3.5–5.1)
Sodium: 135 mmol/L (ref 135–145)
TOTAL PROTEIN: 5.8 g/dL — AB (ref 6.5–8.1)

## 2015-06-25 LAB — CBC WITH DIFFERENTIAL/PLATELET
BASOS ABS: 0 10*3/uL (ref 0.0–0.1)
Basophils Relative: 0 %
EOS PCT: 0 %
Eosinophils Absolute: 0 10*3/uL (ref 0.0–0.7)
HEMATOCRIT: 28.4 % — AB (ref 39.0–52.0)
HEMOGLOBIN: 9.2 g/dL — AB (ref 13.0–17.0)
LYMPHS ABS: 0.6 10*3/uL — AB (ref 0.7–4.0)
LYMPHS PCT: 5 %
MCH: 30.4 pg (ref 26.0–34.0)
MCHC: 32.4 g/dL (ref 30.0–36.0)
MCV: 93.7 fL (ref 78.0–100.0)
Monocytes Absolute: 0.7 10*3/uL (ref 0.1–1.0)
Monocytes Relative: 5 %
NEUTROS ABS: 11.3 10*3/uL — AB (ref 1.7–7.7)
NEUTROS PCT: 90 %
PLATELETS: 224 10*3/uL (ref 150–400)
RBC: 3.03 MIL/uL — AB (ref 4.22–5.81)
RDW: 14.8 % (ref 11.5–15.5)
WBC: 12.5 10*3/uL — AB (ref 4.0–10.5)

## 2015-06-25 LAB — BASIC METABOLIC PANEL
Anion gap: 6 (ref 5–15)
BUN: 66 mg/dL — AB (ref 6–20)
CHLORIDE: 113 mmol/L — AB (ref 101–111)
CO2: 17 mmol/L — AB (ref 22–32)
CREATININE: 4.19 mg/dL — AB (ref 0.61–1.24)
Calcium: 7.6 mg/dL — ABNORMAL LOW (ref 8.9–10.3)
GFR calc non Af Amer: 11 mL/min — ABNORMAL LOW (ref >60)
GFR, EST AFRICAN AMERICAN: 13 mL/min — AB (ref >60)
GLUCOSE: 97 mg/dL (ref 65–99)
Potassium: 5.1 mmol/L (ref 3.5–5.1)
Sodium: 136 mmol/L (ref 135–145)

## 2015-06-25 LAB — PROTIME-INR
INR: 1.56 — ABNORMAL HIGH (ref 0.00–1.49)
Prothrombin Time: 18.7 seconds — ABNORMAL HIGH (ref 11.6–15.2)

## 2015-06-25 LAB — URINE MICROSCOPIC-ADD ON

## 2015-06-25 LAB — APTT: aPTT: 37 seconds (ref 24–37)

## 2015-06-25 MED ORDER — AMLODIPINE BESYLATE 5 MG PO TABS
5.0000 mg | ORAL_TABLET | Freq: Every day | ORAL | Status: DC
  Administered 2015-06-26 – 2015-06-28 (×3): 5 mg via ORAL
  Filled 2015-06-25 (×3): qty 1

## 2015-06-25 MED ORDER — LEVOFLOXACIN IN D5W 500 MG/100ML IV SOLN
500.0000 mg | Freq: Once | INTRAVENOUS | Status: AC
  Administered 2015-06-25: 500 mg via INTRAVENOUS
  Filled 2015-06-25: qty 100

## 2015-06-25 MED ORDER — TAMSULOSIN HCL 0.4 MG PO CAPS
0.4000 mg | ORAL_CAPSULE | Freq: Every day | ORAL | Status: DC
  Administered 2015-06-26 – 2015-06-28 (×3): 0.4 mg via ORAL
  Filled 2015-06-25 (×3): qty 1

## 2015-06-25 MED ORDER — SODIUM CHLORIDE 0.9 % IV BOLUS (SEPSIS)
500.0000 mL | Freq: Once | INTRAVENOUS | Status: AC
  Administered 2015-06-25: 500 mL via INTRAVENOUS

## 2015-06-25 MED ORDER — ATORVASTATIN CALCIUM 20 MG PO TABS
20.0000 mg | ORAL_TABLET | Freq: Every day | ORAL | Status: DC
  Administered 2015-06-26 – 2015-06-28 (×3): 20 mg via ORAL
  Filled 2015-06-25 (×3): qty 1

## 2015-06-25 MED ORDER — DEXTROSE 5 % IV SOLN
1.0000 g | INTRAVENOUS | Status: DC
  Administered 2015-06-26 – 2015-06-27 (×3): 1 g via INTRAVENOUS
  Filled 2015-06-25 (×3): qty 10

## 2015-06-25 MED ORDER — SODIUM CHLORIDE 0.9 % IV SOLN
INTRAVENOUS | Status: DC
  Administered 2015-06-26 – 2015-06-27 (×3): via INTRAVENOUS

## 2015-06-25 MED ORDER — AMIODARONE HCL 200 MG PO TABS
200.0000 mg | ORAL_TABLET | Freq: Every day | ORAL | Status: DC
  Administered 2015-06-26 – 2015-06-28 (×3): 200 mg via ORAL
  Filled 2015-06-25 (×3): qty 1

## 2015-06-25 MED ORDER — PANTOPRAZOLE SODIUM 40 MG PO TBEC
40.0000 mg | DELAYED_RELEASE_TABLET | Freq: Every day | ORAL | Status: DC
  Administered 2015-06-26 – 2015-06-28 (×3): 40 mg via ORAL
  Filled 2015-06-25 (×3): qty 1

## 2015-06-25 MED ORDER — OXYBUTYNIN CHLORIDE ER 10 MG PO TB24
10.0000 mg | ORAL_TABLET | Freq: Every day | ORAL | Status: DC
  Administered 2015-06-26 (×2): 10 mg via ORAL
  Filled 2015-06-25 (×3): qty 1

## 2015-06-25 MED ORDER — ISOSORBIDE MONONITRATE ER 30 MG PO TB24
30.0000 mg | ORAL_TABLET | Freq: Every day | ORAL | Status: DC
  Administered 2015-06-26 – 2015-06-28 (×3): 30 mg via ORAL
  Filled 2015-06-25 (×3): qty 1

## 2015-06-25 MED ORDER — METOPROLOL SUCCINATE ER 25 MG PO TB24
25.0000 mg | ORAL_TABLET | Freq: Every day | ORAL | Status: DC
  Administered 2015-06-26 – 2015-06-28 (×3): 25 mg via ORAL
  Filled 2015-06-25 (×3): qty 1

## 2015-06-25 MED ORDER — PREDNISONE 10 MG PO TABS
10.0000 mg | ORAL_TABLET | Freq: Every day | ORAL | Status: DC
  Administered 2015-06-26 – 2015-06-28 (×3): 10 mg via ORAL
  Filled 2015-06-25 (×3): qty 1

## 2015-06-25 MED ORDER — TRAMADOL HCL 50 MG PO TABS
50.0000 mg | ORAL_TABLET | Freq: Two times a day (BID) | ORAL | Status: DC | PRN
  Administered 2015-06-26: 50 mg via ORAL
  Filled 2015-06-25 (×2): qty 1

## 2015-06-25 MED ORDER — DEXTROSE 5 % IV SOLN
500.0000 mg | Freq: Every day | INTRAVENOUS | Status: DC
  Administered 2015-06-26 – 2015-06-27 (×2): 500 mg via INTRAVENOUS
  Filled 2015-06-25 (×3): qty 500

## 2015-06-25 MED ORDER — SODIUM CHLORIDE 0.9 % IV SOLN
INTRAVENOUS | Status: DC
  Administered 2015-06-25 (×2): via INTRAVENOUS

## 2015-06-25 MED ORDER — ALBUTEROL SULFATE (2.5 MG/3ML) 0.083% IN NEBU
2.5000 mg | INHALATION_SOLUTION | RESPIRATORY_TRACT | Status: DC | PRN
  Administered 2015-06-28: 2.5 mg via RESPIRATORY_TRACT
  Filled 2015-06-25: qty 3

## 2015-06-25 MED ORDER — OXYCODONE-ACETAMINOPHEN 7.5-325 MG PO TABS
1.0000 | ORAL_TABLET | Freq: Three times a day (TID) | ORAL | Status: DC | PRN
  Administered 2015-06-26 – 2015-06-27 (×3): 1 via ORAL
  Filled 2015-06-25 (×4): qty 1

## 2015-06-25 MED ORDER — MORPHINE SULFATE (PF) 2 MG/ML IV SOLN
2.0000 mg | INTRAVENOUS | Status: AC | PRN
  Administered 2015-06-25 (×2): 2 mg via INTRAVENOUS
  Filled 2015-06-25 (×2): qty 1

## 2015-06-25 MED ORDER — CALCITRIOL 0.25 MCG PO CAPS
0.2500 ug | ORAL_CAPSULE | Freq: Every day | ORAL | Status: DC
  Administered 2015-06-26 – 2015-06-28 (×3): 0.25 ug via ORAL
  Filled 2015-06-25 (×3): qty 1

## 2015-06-25 MED ORDER — GUAIFENESIN ER 600 MG PO TB12
600.0000 mg | ORAL_TABLET | Freq: Two times a day (BID) | ORAL | Status: DC
  Administered 2015-06-26 – 2015-06-28 (×7): 600 mg via ORAL
  Filled 2015-06-25 (×7): qty 1

## 2015-06-25 NOTE — ED Notes (Signed)
[facility] is aware of room [phone] for transfer at [facility]

## 2015-06-25 NOTE — H&P (Signed)
George Noble [id number] DOB: July 01, 1924 DOA: 07/18/2015     PCP: [name], MD   Outpatient Specialists: None   Patient coming from:  home Lives       With family   Chief Complaint: Fever and confusion  HPI: George Noble is a 82 y.o. male with medical history significant of coronary disease, atrial fibrillation on chronic anticoagulation, rheumatoid arthritis, HTN, chronic kidney disease chronic anemia, lymphoma recent falls with T12-L1 compression fractures    Presented with fever up to 102 patient appears to be weak and disoriented confused which is not his baseline Of note 3 weeks ago patient had a fall resulting 30% T12 compression fracture multiple rib fractures. He's been on pain medicines but pain has persisted. He has not been able to ambulate as well as he used to. Progressively getting worse over the past few weeks patient reports persistent back pain she is worse with any movement. He had had some intermittent cough no dysuria and abdominal pain no headache, no chest pain  Regarding pertinent Chronic problems: Patient is originally from [place] recently established care doctor past but had not establish care with subspecialists.  History of chronic kidney disease Baseline creatinine 3.7 was recommended to follow up with nephrology declined fistula placement. Was diagnosed with rheumatoid arthritis and refer to rheumatology Patient has known history of coronary artery disease and atrial fibrillation on Coumadin recently was held secondary to elevated INR and amiodarone currently rate controlled. Patient has reportedly history of lymphoma but was told he does not need any treatment he has history of chronic anemia requiring recurrent transfusions   IN ER: An presented to initiate treatment Center at [facility] neurologically was intact. But confused Afebrile heart rate up to 83 blood pressure 140s over 59 WBC 12.5 hemoglobin 9.2 potassium 5.3 creatinine 4.2 Bicarbonate 15 INR  1.56  CT head non-acute, lumbar spine mild compression fractures T12 and L1 Chest x-ray left lower lobe infiltrate  A she was started on Levaquin given 500 mL bolus and given 2 mg of morphine x2 Patient was transferred to [facility], The was called for admission for community acquired pneumonia presumed UTI  Review of Systems:    Pertinent positives include: Fevers, chills, fatigue, non-productive cough,  Constitutional:  No weight loss, night sweats,  weight loss  HEENT:  No headaches, Difficulty swallowing,Tooth/dental problems,Sore throat,  No sneezing, itching, ear ache, nasal congestion, post nasal drip,  Cardio-vascular:  No chest pain, Orthopnea, PND, anasarca, dizziness, palpitations.no Bilateral lower extremity swelling  GI:  No heartburn, indigestion, abdominal pain, nausea, vomiting, diarrhea, change in bowel habits, loss of appetite, melena, blood in stool, hematemesis Resp:  no shortness of breath at rest. No dyspnea on exertion, No excess mucus, no productive cough, No No coughing up of blood.No change in color of mucus.No wheezing. Skin:  no rash or lesions. No jaundice GU:  no dysuria, change in color of urine, no urgency or frequency. No straining to urinate.  No flank pain.  Musculoskeletal:  No joint pain or no joint swelling. No decreased range of motion. No back pain.  Psych:  No change in mood or affect. No depression or anxiety. No memory loss.  Neuro: no localizing neurological complaints, no tingling, no weakness, no double vision, no gait abnormality, no slurred speech, no confusion  As per HPI otherwise 10 point review of systems negative.   Past Medical History: Past Medical History  Diagnosis Date  . Seropositive rheumatoid arthritis of multiple sites (  [place]) 11/2013  . Inflammatory polyarthropathy ([place])   . Low grade B-cell lymphoma ([place])   . CAD (coronary artery disease)   . Myocardial infarction [facility]) 1995    inferior, multivessel CABG   . H/O abdominal aortic aneurysm   . Atrial fibrillation, permanent ([place])   . Hyperlipidemia   . Valvular insufficiency   . Complete left bundle branch block (LBBB)     on EKG  . GERD (gastroesophageal reflux disease)   . Anemia     low iron stores  . Hypertension   . History of chicken pox   . Chronic kidney disease    Past Surgical History  Procedure Laterality Date  . Coronary artery bypass graft  1995    multivessel  . Inguinal hernia repair Bilateral      Social History:  Ambulatory   independently but has been falling a lot    reports that he has quit smoking. He does not have any smokeless tobacco history on file. He reports that he drinks alcohol. He reports that he does not use illicit drugs.  Allergies:   Allergies  Allergen Reactions  . Penicillins Other (See Comments)    Unsure of reaction  . Sulfa Antibiotics Other (See Comments)    Unsure of reaction       Family History:    Family History  Problem Relation Age of Onset  . Rheum arthritis Mother   . CAD Father   . Rheum arthritis Sister   . Breast cancer Sister   . Diabetes Neg Hx   . Stroke Neg Hx     Medications: Prior to Admission medications   Medication Sig Start Date End Date Taking? Authorizing Provider  amiodarone (PACERONE) 200 MG tablet Take 200 mg by mouth daily.   Yes Historical Provider, MD  amLODipine (NORVASC) 5 MG tablet Take 5 mg by mouth daily.   Yes Historical Provider, MD  atorvastatin (LIPITOR) 20 MG tablet Take 20 mg by mouth daily.   Yes Historical Provider, MD  calcitRIOL (ROCALTROL) 0.25 MCG capsule Take 1 capsule (0.25 mcg total) by mouth daily. 06/20/15  Yes Colon Branch, MD  ERGOCALCIFEROL PO Take 1.25 mg by mouth once a week.   Yes Historical Provider, MD  isosorbide mononitrate (IMDUR) 30 MG 24 hr tablet Take 30 mg by mouth daily.   Yes Historical Provider, MD  metoprolol succinate (TOPROL-XL) 25 MG 24 hr tablet Take 25 mg by mouth daily.   Yes Historical Provider,  MD  Multiple Vitamins-Minerals (PRESERVISION AREDS 2) CAPS Take by mouth daily. Reported on 06/07/2015   Yes Historical Provider, MD  omeprazole (PRILOSEC) 20 MG capsule Take 1 capsule (20 mg total) by mouth daily. 06/20/15  Yes Colon Branch, MD  oxybutynin (DITROPAN-XL) 10 MG 24 hr tablet Take 10 mg by mouth at bedtime.   Yes Historical Provider, MD  oxyCODONE-acetaminophen (PERCOCET) 7.5-325 MG tablet Take 1 tablet by mouth every 8 (eight) hours as needed for severe pain. 06/21/15  Yes Colon Branch, MD  predniSONE (DELTASONE) 10 MG tablet Take 10 mg by mouth daily with breakfast.   Yes Historical Provider, MD  tamsulosin (FLOMAX) 0.4 MG CAPS capsule Take 0.4 mg by mouth daily.   Yes Historical Provider, MD  Tofacitinib Citrate (XELJANZ) 5 MG TABS Take 1 tablet by mouth daily.   Yes Historical Provider, MD  traMADol (ULTRAM) 50 MG tablet Take 0.5-1 tablets (25-50 mg total) by mouth every 12 (twelve) hours as needed. 06/08/15  Yes Jose  Ladona Horns, MD  warfarin (COUMADIN) 3 MG tablet Take 1.5 mg by mouth daily. Monday and Thursday 1/2 tablet   Yes Historical Provider, MD    Physical Exam: Patient Vitals for the past 24 hrs:  BP Temp Temp src Pulse Resp SpO2 Height Weight  07/10/2015 2201 (!) 140/59 mmHg 99.2 F (37.3 C) Oral 70 20 95 % - -  07/02/2015 1901 (!) 154/100 mmHg 98.6 F (37 C) Oral 75 (!) 24 96 % 6' (1.829 m) 71.215 kg (157 lb)  07/15/2015 1753 157/68 mmHg 99.4 F (37.4 C) Oral 76 22 93 % - -  07/14/2015 1710 138/86 mmHg - - 70 20 92 % - -  06/27/2015 1600 (!) 144/35 mmHg - - 69 18 90 % - -  06/24/2015 1508 145/66 mmHg - - 74 22 95 % - -  07/20/2015 1500 145/66 mmHg - - 74 22 95 % - -  07/17/2015 1434 143/77 mmHg - - - 18 - - -  07/01/2015 1400 154/77 mmHg - - 80 19 98 % - -  06/24/2015 1330 - 98.6 F (37 C) Oral - - - - -  06/21/2015 1330 153/71 mmHg - - 83 25 90 % - -  07/16/2015 1209 140/65 mmHg 99.3 F (37.4 C) Oral 76 18 92 % 5\' 10"  (1.778 m) 66.225 kg (146 lb)    1. General:  in No Acute distress 2.  Psychological: Alert But not Oriented 3. Head/ENT:    Dry Mucous Membranes                          Head Non traumatic, neck supple                            Poor Dentition 4. SKIN:   decreased Skin turgor,  Skin clean Dry and intact no rash 5. Heart: Regular rate and rhythm no Murmur, Rub or gallop 6. Lungs: no wheezes some crackles   7. Abdomen: Soft, non-tender, Non distended 8. Lower extremities: no clubbing, cyanosis, or edema 9. Neurologically Grossly intact, moving all 4 extremities equally 10. MSK: Normal range of motion   body mass index is 21.29 kg/(m^2).  Labs on Admission:   Labs on Admission: I have personally reviewed following labs and imaging studies  CBC:  Recent Labs Lab 07/16/2015 1330  WBC 12.5*  NEUTROABS 11.3*  HGB 9.2*  HCT 28.4*  MCV 93.7  PLT [id number]   Basic Metabolic Panel:  Recent Labs Lab 07/14/2015 1330  NA 135  K 5.3*  CL 109  CO2 15*  GLUCOSE 110*  BUN 66*  CREATININE 4.23*  CALCIUM 8.0*   GFR: Estimated Creatinine Clearance: 12.2 mL/min (by C-G formula based on Cr of 4.23). Liver Function Tests:  Recent Labs Lab 07/03/2015 1330  AST 22  ALT 17  ALKPHOS 45  BILITOT 1.0  PROT 5.8*  ALBUMIN 2.8*   No results for input(s): LIPASE, AMYLASE in the last 168 hours. No results for input(s): AMMONIA in the last 168 hours. Coagulation Profile:  Recent Labs Lab 06/19/15 1616 06/21/15 1512 06/29/2015 1330  INR 4.1 3.8 1.56*   Cardiac Enzymes: No results for input(s): CKTOTAL, CKMB, CKMBINDEX, TROPONINI in the last 168 hours. BNP (last 3 results) No results for input(s): PROBNP in the last 8760 hours. HbA1C: No results for input(s): HGBA1C in the last 72 hours. CBG: No results for input(s): GLUCAP in the last 168  hours. Lipid Profile: No results for input(s): CHOL, HDL, LDLCALC, TRIG, CHOLHDL, LDLDIRECT in the last 72 hours. Thyroid Function Tests: No results for input(s): TSH, T4TOTAL, FREET4, T3FREE, THYROIDAB in the last 72  hours. Anemia Panel: No results for input(s): VITAMINB12, FOLATE, FERRITIN, TIBC, IRON, RETICCTPCT in the last 72 hours. Urine analysis:    Component Value Date/Time   COLORURINE YELLOW 06/22/2015 1255   APPEARANCEUR TURBID* 07/04/2015 1255   LABSPEC 1.014 06/29/2015 1255   PHURINE 8.0 07/11/2015 1255   GLUCOSEU NEGATIVE 07/09/2015 1255   HGBUR TRACE* 06/29/2015 1255   BILIRUBINUR NEGATIVE 06/24/2015 1255   KETONESUR NEGATIVE 07/14/2015 1255   PROTEINUR >300* 07/10/2015 1255   NITRITE NEGATIVE 07/15/2015 1255   LEUKOCYTESUR SMALL* 06/29/2015 1255   Sepsis Labs: @LABRCNTIP (procalcitonin:4,lacticidven:4) )No results found for this or any previous visit (from the past 240 hour(s)).      Urine culture pending UA Question evidence of UTI 6-30 white blood cells and many bacteria no nitrates  No results found for: HGBA1C  Estimated Creatinine Clearance: 12.2 mL/min (by C-G formula based on Cr of 4.23).  BNP (last 3 results) No results for input(s): PROBNP in the last 8760 hours.   ECG REPORT  Independently reviewed Rate: 72  Rhythm: LBBB ST&T Change:NA QTC 492  Filed Weights   07/17/2015 1209 07/05/2015 1901  Weight: 66.225 kg (146 lb) 71.215 kg (157 lb)     Cultures: No results found for: SDES, SPECREQUEST, CULT, REPTSTATUS   Radiological Exams on Admission: Dg Chest 2 View  07/03/2015  CLINICAL DATA:  Fall 3 weeks ago.  Fever. EXAM: CHEST  2 VIEW COMPARISON:  None. FINDINGS: Mild cardiomegaly. There is infiltrate in the left perihilar region, extending into the left base with a small effusion. The left hilum is largely obscured. The right hilum and mediastinum are unremarkable. No other acute abnormalities. IMPRESSION: Left lower lobe infiltrate with a small effusion. Given the history of fever, this likely represents pneumonia. However, recommend treatment with a follow-up chest x-ray in 2 or 3 weeks to ensure resolution. Electronically Signed   By: Dorise Bullion III M.D    On: 07/17/2015 13:29   Dg Thoracic Spine 2 View  07/18/2015  CLINICAL DATA:  Fall 3 weeks ago.  Compression fracture. EXAM: THORACIC SPINE 2 VIEWS COMPARISON:  Thoracic spine image 06/07/2015. Lumbar spine images performed today. FINDINGS: The T12 and L1 compression fractures are better seen on today's lumbar spine series. No additional acute bony abnormality or malalignment. IMPRESSION: Mild T12 and L1 compression fractures, better seen on today's lumbar spine series. Electronically Signed   By: Rolm Baptise M.D.   On: 07/07/2015 13:34   Dg Lumbar Spine Complete  07/03/2015  CLINICAL DATA:  Fall 3 weeks ago with compression fracture and rib fractures. Persistent pain. EXAM: LUMBAR SPINE - COMPLETE 4+ VIEW COMPARISON:  Thoracic spine images 06/07/2015 FINDINGS: There are mild compression fractures involving the T12 and L1 vertebral bodies with approximately 30% loss of anterior vertebral body height. T12 appear stable since prior study. The compression fracture at L1 is stable or progressed since prior study. No additional acute bony abnormality. Degenerative facet disease in the lower lumbar spine. Diffuse aortoiliac atherosclerosis. Slight dilatation of the distal abdominal aorta which measures up to 3.2 cm. IMPRESSION: Mild compression fractures involving the T12 and L1 vertebral bodies. Mildly aneurysmal dilatation of the distal abdominal aorta, 3.2 cm by plain film. Electronically Signed   By: Rolm Baptise M.D.   On: 07/19/2015 13:31  Ct Head Wo Contrast  06/21/2015  CLINICAL DATA:  Fever.  Altered mental status.  Recent fall. EXAM: CT HEAD WITHOUT CONTRAST TECHNIQUE: Contiguous axial images were obtained from the base of the skull through the vertex without intravenous contrast. COMPARISON:  None. FINDINGS: Partially motion degraded study. Simple circumscribed 3.2 x 1.6 cm subcutaneous simple fluid density structure in the left inferior occipital scalp, consistent with a sebaceous cyst. No evidence of  parenchymal hemorrhage or extra-axial fluid collection. No mass lesion, mass effect, or midline shift. No CT evidence of acute infarction. Intracranial atherosclerosis. Generalized cerebral volume loss. Nonspecific moderate subcortical and periventricular white matter hypodensity, most in keeping with chronic small vessel ischemic change. No ventriculomegaly. The visualized paranasal sinuses are essentially clear. The right mastoid air cells are unopacified. Near complete left mastoid effusion. No evidence of calvarial fracture. IMPRESSION: 1. No evidence of acute intracranial abnormality. No evidence of calvarial fracture. 2. Generalized cerebral volume loss and moderate chronic small vessel ischemia. 3. Near complete left mastoid effusion, probably inflammatory. Electronically Signed   By: Ilona Sorrel M.D.   On: 06/29/2015 13:26    Chart has been reviewed    Assessment/Plan  82 y.o. male with medical history significant of coronary disease, atrial fibrillation on chronic anticoagulation, rheumatoid arthritis, HTN, chronic kidney disease chronic anemia, lymphoma recent falls with T12-L1 compression fractures. Altered mental status due to community-acquired pneumonia and possible UTI    Present on Admission:  . CAP (community acquired pneumonia) - - will admit for treatment of CAP with ceftriaxone and zythromycine. Denies severe reaction to Penicillin states had some reaction while in the [employer]. will start on appropriate antibiotic coverage.   Obtain sputum cultures, blood cultures if febrile or if decompensates.  Provide oxygen as needed.  Marland Kitchen UTI (lower urinary tract infection) continue Levaquin  . Anemia, iron deficiency in type and screen and transfuse for hemoglobin below 7 obtain anemia panel . Atrial fibrillation, permanent ([place]) - continue amiodarone CHA2Ds-VASC score 4 given risk of recurrent  falls, advance age   discussed with family and will hold off on coumadin for now.  . Chronic kidney  disease with acute on chronic renal failure likely secondary to dehydration will administer IV fluids and monitor creatinine  . HTN (hypertension) stable continue home medications  . Acute encephalopathy secondary to underlying infection CAD - continue home medications. Currently stable LBBB - unsure if chronic, no prior ECG. No chest pain will monitor Other plan as per orders.  DVT prophylaxis:  lovenox  Code Status:  Limitted code as per  family   Family Communication:   Family  at  Bedside  plan of care was discussed with , Daughter Lianne Bushy [phone]   Disposition Plan:     To home once workup is complete and patient is stable   Consults called: none   Admission status:    inpatient      Level of care     tele          [place] 06/29/2015, 11:34 PM     [facility]  Pager [phone]   after 2 AM please page floor coverage PA If 7AM-7PM, please contact the day team taking care of the patient  [email or web address]  Password [email or web address]

## 2015-06-25 NOTE — Progress Notes (Signed)
07/09/2015 6:55 PM  Pt arrived to 6E15 via [facility] from [place] right before shift change.  Was able to place patient on tele box #14, confirmed with CCMD and float RN.  Family en route.  Was able to see sacrum/buttocks, MASD noted to area.  Will report off to night shift nurse that will follow-up with admission details.    George Noble

## 2015-06-25 NOTE — Telephone Encounter (Signed)
Caller name: [name] Relationship to patient: daughter Can be reached: [phone]  Reason for call: Pt daughter called in stating pt has fever of 102, he is weak, and is disoriented. Transferred to [facility] with [facility].

## 2015-06-25 NOTE — Telephone Encounter (Signed)
Patient Name: George Noble  DOB: Dec 23, 1924    Initial Comment Caller states patient has fever 102, confused/disoriented, weak.   Nurse Assessment  Nurse: Leilani Merl, RN, Heather Date/Time (Eastern Time): 07/14/2015 8:47:39 AM  Confirm and document reason for call. If symptomatic, describe symptoms. You must click the next button to save text entered. ---Caller states patient has fever 102, disoriented, weak. This started this morning  Has the patient traveled out of the country within the last 30 days? ---Not Applicable  Does the patient have any new or worsening symptoms? ---Yes  Will a triage be completed? ---Yes  Related visit to physician within the last 2 weeks? ---No  Does the PT have any chronic conditions? (i.e. diabetes, asthma, etc.) ---Yes  List chronic conditions. ---See MR, back problems, broken ribs  Is this a behavioral health or substance abuse call? ---No     Guidelines    Guideline Title Affirmed Question Affirmed Notes  Fever Difficulty breathing    Final Disposition User   Go to ED Now [place], RN, [employer]    Referrals  [facility] - ED   Disagree/Comply: Comply

## 2015-06-25 NOTE — ED Notes (Signed)
He fell 3 weeks ago and sustained a compression fracture and rib fractures. He had a fever of 102 this am. Daughter states he is confused. No recent antibiotics.

## 2015-06-25 NOTE — ED Provider Notes (Signed)
CSN: [id number]     Arrival date & time 06/22/2015  1200 History   First MD Initiated Contact with Patient 06/23/2015 1234     Chief Complaint  Patient presents with  . Fever  . Altered Mental Status    HPI Patient presents to the emergency room for increasing weakness, confusion and fever measured to 102 this morning. Patient has a history of a fall back in the middle of May. Patient saw his primary doctor and was noted to have a 30% compression fracture at T12. Patient has been taking medications for pain at home. He has had persistent pain which is unusual for him. Mobility has been decreased. He has not been sleeping well at night. This seems to be progressively getting worse over the last several weeks. It has been in contact with his primary doctor and the family states he is getting referred to pain management. This morning however the patient was very weak. The daughter took his temperature and it was 102. She brought him to the emergency room for further evaluation. Patient himself complains of persistent pain in his back. It increases with movement. He has had some intermittent cough. He denies any abdominal pain or dysuria. He denies any headache. Past Medical History  Diagnosis Date  . Seropositive rheumatoid arthritis of multiple sites ([place]) 11/2013  . Inflammatory polyarthropathy ([employer])   . Low grade B-cell lymphoma ([place])   . CAD (coronary artery disease)   . Myocardial infarction [facility]) 1995    inferior, multivessel CABG  . H/O abdominal aortic aneurysm   . Atrial fibrillation, permanent ([place])   . Hyperlipidemia   . Valvular insufficiency   . Complete left bundle branch block (LBBB)     on EKG  . GERD (gastroesophageal reflux disease)   . Anemia     low iron stores  . Hypertension   . History of chicken pox   . Chronic kidney disease    Past Surgical History  Procedure Laterality Date  . Coronary artery bypass graft  1995    multivessel  . Inguinal hernia repair Bilateral     No family history on file. Social History  Substance Use Topics  . Smoking status: Former Research scientist (life sciences)  . Smokeless tobacco: None     Comment: quit remotely   . Alcohol Use: 0.0 oz/week    0 Standard drinks or equivalent per week     Comment: not daily     Review of Systems  All other systems reviewed and are negative.     Allergies  Penicillins and Sulfa antibiotics  Home Medications   Prior to Admission medications   Medication Sig Start Date End Date Taking? Authorizing Provider  amiodarone (PACERONE) 200 MG tablet Take 200 mg by mouth daily.    Historical Provider, MD  amLODipine (NORVASC) 5 MG tablet Take 5 mg by mouth daily.    Historical Provider, MD  atorvastatin (LIPITOR) 20 MG tablet Take 20 mg by mouth daily.    Historical Provider, MD  calcitRIOL (ROCALTROL) 0.25 MCG capsule Take 1 capsule (0.25 mcg total) by mouth daily. 06/20/15   Colon Branch, MD  ERGOCALCIFEROL PO Take 1.25 mg by mouth once a week.    Historical Provider, MD  isosorbide mononitrate (IMDUR) 30 MG 24 hr tablet Take 30 mg by mouth daily.    Historical Provider, MD  metoprolol succinate (TOPROL-XL) 25 MG 24 hr tablet Take 25 mg by mouth daily.    Historical Provider, MD  Multiple Vitamins-Minerals (PRESERVISION  AREDS 2) CAPS Take by mouth daily. Reported on 06/07/2015    Historical Provider, MD  omeprazole (PRILOSEC) 20 MG capsule Take 1 capsule (20 mg total) by mouth daily. 06/20/15   Colon Branch, MD  oxybutynin (DITROPAN-XL) 10 MG 24 hr tablet Take 10 mg by mouth at bedtime.    Historical Provider, MD  oxyCODONE-acetaminophen (PERCOCET) 7.5-325 MG tablet Take 1 tablet by mouth every 8 (eight) hours as needed for severe pain. 06/21/15   Colon Branch, MD  predniSONE (DELTASONE) 10 MG tablet Take 10 mg by mouth daily with breakfast.    Historical Provider, MD  tamsulosin (FLOMAX) 0.4 MG CAPS capsule Take 0.4 mg by mouth daily.    Historical Provider, MD  Tofacitinib Citrate (XELJANZ) 5 MG TABS Take 1 tablet by  mouth daily.    Historical Provider, MD  traMADol (ULTRAM) 50 MG tablet Take 0.5-1 tablets (25-50 mg total) by mouth every 12 (twelve) hours as needed. 06/08/15   Colon Branch, MD  warfarin (COUMADIN) 3 MG tablet Take 3 mg by mouth daily. Monday and Thursday 1/2 tablet    Historical Provider, MD   BP 154/77 mmHg  Pulse 80  Temp(Src) 98.6 F (37 C) (Oral)  Resp 19  Ht 5\' 10"  (1.778 m)  Wt 66.225 kg  BMI 20.95 kg/m2  SpO2 98% Physical Exam  Constitutional: No distress.  Elderly, frail  HENT:  Head: Normocephalic and atraumatic.  Right Ear: External ear normal.  Left Ear: External ear normal.  Eyes: Conjunctivae are normal. Right eye exhibits no discharge. Left eye exhibits no discharge. No scleral icterus.  Neck: Neck supple. No tracheal deviation present.  Cardiovascular: Normal rate, regular rhythm and intact distal pulses.   Pulmonary/Chest: Effort normal and breath sounds normal. No stridor. No respiratory distress. He has no wheezes. He has no rales.  Occasional coughing  Abdominal: Soft. Bowel sounds are normal. He exhibits no distension. There is no tenderness. There is no rebound and no guarding.  Musculoskeletal: He exhibits no edema.       Thoracic back: He exhibits tenderness.       Lumbar back: He exhibits decreased range of motion and tenderness.  No erythema or deformity  Neurological: He is alert. No cranial nerve deficit (no facial droop, extraocular movements intact, no slurred speech) or sensory deficit. He exhibits normal muscle tone. He displays no seizure activity. Coordination normal.  Patient is able to lift both legs and both arms off the bed, no facial droop. Normal speech. Memory is poor with regards to the timing of certain events but he is able to clearly answer questions and joke around  Skin: Skin is warm and dry. No rash noted.  No skin rashes, no evidence of cellulitis  Psychiatric: He has a normal mood and affect.  Nursing note and vitals  reviewed.   ED Course  Procedures   Medications  0.9 %  sodium chloride infusion ( Intravenous New Bag/Given 06/24/2015 1340)  morphine 2 MG/ML injection 2 mg (2 mg Intravenous Given 07/04/2015 1403)  levofloxacin (LEVAQUIN) IVPB 500 mg (500 mg Intravenous New Bag/Given 07/11/2015 1404)    Labs Review Labs Reviewed  COMPREHENSIVE METABOLIC PANEL - Abnormal; Notable for the following:    Potassium 5.3 (*)    CO2 15 (*)    Glucose, Bld 110 (*)    BUN 66 (*)    Creatinine, Ser 4.23 (*)    Calcium 8.0 (*)    Total Protein 5.8 (*)  Albumin 2.8 (*)    GFR calc non Af Amer 11 (*)    GFR calc Af Amer 13 (*)    All other components within normal limits  CBC WITH DIFFERENTIAL/PLATELET - Abnormal; Notable for the following:    WBC 12.5 (*)    RBC 3.03 (*)    Hemoglobin 9.2 (*)    HCT 28.4 (*)    Neutro Abs 11.3 (*)    Lymphs Abs 0.6 (*)    All other components within normal limits  PROTIME-INR - Abnormal; Notable for the following:    Prothrombin Time 18.7 (*)    INR 1.56 (*)    All other components within normal limits  URINALYSIS, ROUTINE W REFLEX MICROSCOPIC (NOT AT [facility]) - Abnormal; Notable for the following:    APPearance TURBID (*)    Hgb urine dipstick TRACE (*)    Protein, ur >300 (*)    Leukocytes, UA SMALL (*)    All other components within normal limits  URINE MICROSCOPIC-ADD ON - Abnormal; Notable for the following:    Squamous Epithelial / LPF 0-5 (*)    Bacteria, UA MANY (*)    All other components within normal limits  URINE CULTURE  APTT    Imaging Review Dg Chest 2 View  07/20/2015  CLINICAL DATA:  Fall 3 weeks ago.  Fever. EXAM: CHEST  2 VIEW COMPARISON:  None. FINDINGS: Mild cardiomegaly. There is infiltrate in the left perihilar region, extending into the left base with a small effusion. The left hilum is largely obscured. The right hilum and mediastinum are unremarkable. No other acute abnormalities. IMPRESSION: Left lower lobe infiltrate with a small effusion.  Given the history of fever, this likely represents pneumonia. However, recommend treatment with a follow-up chest x-ray in 2 or 3 weeks to ensure resolution. Electronically Signed   By: Dorise Bullion III M.D   On: 06/26/2015 13:29   Dg Thoracic Spine 2 View  07/08/2015  CLINICAL DATA:  Fall 3 weeks ago.  Compression fracture. EXAM: THORACIC SPINE 2 VIEWS COMPARISON:  Thoracic spine image 06/07/2015. Lumbar spine images performed today. FINDINGS: The T12 and L1 compression fractures are better seen on today's lumbar spine series. No additional acute bony abnormality or malalignment. IMPRESSION: Mild T12 and L1 compression fractures, better seen on today's lumbar spine series. Electronically Signed   By: Rolm Baptise M.D.   On: 07/01/2015 13:34   Dg Lumbar Spine Complete  07/20/2015  CLINICAL DATA:  Fall 3 weeks ago with compression fracture and rib fractures. Persistent pain. EXAM: LUMBAR SPINE - COMPLETE 4+ VIEW COMPARISON:  Thoracic spine images 06/07/2015 FINDINGS: There are mild compression fractures involving the T12 and L1 vertebral bodies with approximately 30% loss of anterior vertebral body height. T12 appear stable since prior study. The compression fracture at L1 is stable or progressed since prior study. No additional acute bony abnormality. Degenerative facet disease in the lower lumbar spine. Diffuse aortoiliac atherosclerosis. Slight dilatation of the distal abdominal aorta which measures up to 3.2 cm. IMPRESSION: Mild compression fractures involving the T12 and L1 vertebral bodies. Mildly aneurysmal dilatation of the distal abdominal aorta, 3.2 cm by plain film. Electronically Signed   By: Rolm Baptise M.D.   On: 07/11/2015 13:31   Ct Head Wo Contrast  07/11/2015  CLINICAL DATA:  Fever.  Altered mental status.  Recent fall. EXAM: CT HEAD WITHOUT CONTRAST TECHNIQUE: Contiguous axial images were obtained from the base of the skull through the vertex without intravenous contrast. COMPARISON:  None. FINDINGS: Partially motion degraded study. Simple circumscribed 3.2 x 1.6 cm subcutaneous simple fluid density structure in the left inferior occipital scalp, consistent with a sebaceous cyst. No evidence of parenchymal hemorrhage or extra-axial fluid collection. No mass lesion, mass effect, or midline shift. No CT evidence of acute infarction. Intracranial atherosclerosis. Generalized cerebral volume loss. Nonspecific moderate subcortical and periventricular white matter hypodensity, most in keeping with chronic small vessel ischemic change. No ventriculomegaly. The visualized paranasal sinuses are essentially clear. The right mastoid air cells are unopacified. Near complete left mastoid effusion. No evidence of calvarial fracture. IMPRESSION: 1. No evidence of acute intracranial abnormality. No evidence of calvarial fracture. 2. Generalized cerebral volume loss and moderate chronic small vessel ischemia. 3. Near complete left mastoid effusion, probably inflammatory. Electronically Signed   By: Ilona Sorrel M.D.   On: 07/02/2015 13:26   I have personally reviewed and evaluated these images and lab results as part of my medical decision-making.    MDM   Final diagnoses:  UTI (lower urinary tract infection)  CAP (community acquired pneumonia)  Renal failure  Compression fracture    Patient presented to the emergency room with fever and confusion. He was recently diagnosed with a compression fracture.  X-rays today show mild compression fractures involving T12 and L1.    I suspect these are the source of his persistent low back pain.  Patient's laboratory tests do suggest a urinary tract infection. Chest x-ray also suggest the possibility of pneumonia. The patient has a sulfa and penicillin allergy. I will start him on IV Levaquin. (note: dose ordered prior to renal function results).  Consult the hospitalist service for admission to [facility]    Dorie Rank, MD 06/22/2015 1432

## 2015-06-25 NOTE — ED Notes (Signed)
Pt back from xray, sob. Sats 90% on RA. Placed on 2l/m [place] and RT asked to assess pt.

## 2015-06-25 NOTE — ED Notes (Signed)
Care turned over to [facility].

## 2015-06-25 NOTE — ED Notes (Signed)
Attempt to give report to [place] RN. RN off floor. Left number with secretary to call this RN back.

## 2015-06-25 NOTE — Progress Notes (Signed)
Patient coming from [facility] [place] with acute encephalopathy likely secondary to UTI and possible pneumonia. Accepted to [facility] hospitalist's services. Vital signs stable. Patient okay for admission to telemetry bed. Patient will need H&P in admission orders after arrival.  Linna Darner, MD [place] 06/29/2015, 2:58 PM

## 2015-06-25 NOTE — Telephone Encounter (Signed)
Per note,patient agreed to go directly to ED for evaluation.

## 2015-06-25 NOTE — ED Notes (Signed)
Report given to [facility] on [place].

## 2015-06-25 NOTE — Telephone Encounter (Signed)
Patient's family member called stating patient was in Er and having X-rays.  Patient did not get out of the Er in time for New patient apt.  06/21/2015 apt was cx and resch for 07/04/15.  Patient's called back and was given new patient apt for 07/04/15.  Patient is aware of changed apt

## 2015-06-25 NOTE — ED Notes (Signed)
Report given to Shannon RN with [facility] 

## 2015-06-26 ENCOUNTER — Inpatient Hospital Stay (HOSPITAL_COMMUNITY): Payer: Medicare Other

## 2015-06-26 DIAGNOSIS — L8990 Pressure ulcer of unspecified site, unspecified stage: Secondary | ICD-10-CM

## 2015-06-26 DIAGNOSIS — N183 Chronic kidney disease, stage 3 (moderate): Secondary | ICD-10-CM

## 2015-06-26 DIAGNOSIS — J181 Lobar pneumonia, unspecified organism: Secondary | ICD-10-CM

## 2015-06-26 LAB — CBC WITH DIFFERENTIAL/PLATELET
BASOS ABS: 0 10*3/uL (ref 0.0–0.1)
BASOS PCT: 0 %
EOS PCT: 1 %
Eosinophils Absolute: 0.1 10*3/uL (ref 0.0–0.7)
HEMATOCRIT: 24.7 % — AB (ref 39.0–52.0)
Hemoglobin: 7.9 g/dL — ABNORMAL LOW (ref 13.0–17.0)
LYMPHS ABS: 0.7 10*3/uL (ref 0.7–4.0)
Lymphocytes Relative: 9 %
MCH: 29.5 pg (ref 26.0–34.0)
MCHC: 32 g/dL (ref 30.0–36.0)
MCV: 92.2 fL (ref 78.0–100.0)
MONOS PCT: 5 %
Monocytes Absolute: 0.4 10*3/uL (ref 0.1–1.0)
NEUTROS ABS: 6.8 10*3/uL (ref 1.7–7.7)
Neutrophils Relative %: 85 %
Platelets: 167 10*3/uL (ref 150–400)
RBC: 2.68 MIL/uL — ABNORMAL LOW (ref 4.22–5.81)
RDW: 15 % (ref 11.5–15.5)
WBC: 8 10*3/uL (ref 4.0–10.5)

## 2015-06-26 LAB — COMPREHENSIVE METABOLIC PANEL
ALBUMIN: 2.1 g/dL — AB (ref 3.5–5.0)
ALT: 18 U/L (ref 17–63)
AST: 22 U/L (ref 15–41)
Alkaline Phosphatase: 41 U/L (ref 38–126)
Anion gap: 8 (ref 5–15)
BILIRUBIN TOTAL: 0.9 mg/dL (ref 0.3–1.2)
BUN: 65 mg/dL — AB (ref 6–20)
CO2: 17 mmol/L — AB (ref 22–32)
Calcium: 7.8 mg/dL — ABNORMAL LOW (ref 8.9–10.3)
Chloride: 114 mmol/L — ABNORMAL HIGH (ref 101–111)
Creatinine, Ser: 4.18 mg/dL — ABNORMAL HIGH (ref 0.61–1.24)
GFR calc Af Amer: 13 mL/min — ABNORMAL LOW (ref >60)
GFR calc non Af Amer: 12 mL/min — ABNORMAL LOW (ref >60)
GLUCOSE: 89 mg/dL (ref 65–99)
POTASSIUM: 5.2 mmol/L — AB (ref 3.5–5.1)
Sodium: 139 mmol/L (ref 135–145)
TOTAL PROTEIN: 4.7 g/dL — AB (ref 6.5–8.1)

## 2015-06-26 LAB — IRON AND TIBC
IRON: 14 ug/dL — AB (ref 45–182)
Saturation Ratios: 10 % — ABNORMAL LOW (ref 17.9–39.5)
TIBC: 143 ug/dL — AB (ref 250–450)
UIBC: 129 ug/dL

## 2015-06-26 LAB — STREP PNEUMONIAE URINARY ANTIGEN: Strep Pneumo Urinary Antigen: NEGATIVE

## 2015-06-26 LAB — RETICULOCYTES
RBC.: 2.68 MIL/uL — ABNORMAL LOW (ref 4.22–5.81)
RETIC CT PCT: 1.1 % (ref 0.4–3.1)
Retic Count, Absolute: 29.5 10*3/uL (ref 19.0–186.0)

## 2015-06-26 LAB — EXPECTORATED SPUTUM ASSESSMENT W GRAM STAIN, RFLX TO RESP C

## 2015-06-26 LAB — FOLATE: Folate: 25.8 ng/mL (ref >5.9)

## 2015-06-26 LAB — EXPECTORATED SPUTUM ASSESSMENT W REFEX TO RESP CULTURE

## 2015-06-26 LAB — SODIUM, URINE, RANDOM: SODIUM UR: 33 mmol/L

## 2015-06-26 LAB — TYPE AND SCREEN
ABO/RH(D): A POS
Antibody Screen: NEGATIVE

## 2015-06-26 LAB — VITAMIN B12: Vitamin B-12: 437 pg/mL (ref 180–914)

## 2015-06-26 LAB — CREATININE, URINE, RANDOM: CREATININE, URINE: 44.19 mg/dL

## 2015-06-26 LAB — FERRITIN: FERRITIN: 509 ng/mL — AB (ref 24–336)

## 2015-06-26 LAB — ABO/RH: ABO/RH(D): A POS

## 2015-06-26 MED ORDER — CETYLPYRIDINIUM CHLORIDE 0.05 % MT LIQD
7.0000 mL | Freq: Two times a day (BID) | OROMUCOSAL | Status: DC
  Administered 2015-06-26 – 2015-06-29 (×8): 7 mL via OROMUCOSAL

## 2015-06-26 MED ORDER — WARFARIN SODIUM 2.5 MG PO TABS
2.5000 mg | ORAL_TABLET | Freq: Once | ORAL | Status: AC
  Administered 2015-06-26: 2.5 mg via ORAL
  Filled 2015-06-26: qty 1

## 2015-06-26 MED ORDER — WARFARIN - PHARMACIST DOSING INPATIENT
Freq: Every day | Status: DC
  Administered 2015-06-26: 18:00:00

## 2015-06-26 NOTE — Consult Note (Signed)
WOC wound consult note Reason for Consult: Consult requested for sacrum/buttocks Wound type: Stage 2 pressure injury to bilat buttocks/sacrum area; approx 2X.1X.1cm Pressure Ulcer POA: Yes Wound bed: pink and moist Drainage (amount, consistency, odor) No odor or drainage Periwound: Intact skin surrounding Dressing procedure/placement/frequency: Foam dressing to buttocks, change Q 5 days or PRN soiling.  If dressing is becoming frequently saturated, then leave off and apply barrier cream PRN with each turning and cleaning episode. Please re-consult if further assistance is needed.  Thank-you,  Julien Girt MSN, [place], [place], [place], [place]

## 2015-06-26 NOTE — Progress Notes (Addendum)
PROGRESS NOTE                                                                                                                                                                                                             Patient Demographics:    George Noble, is a 82 y.o. male, DOB - 1924-02-15, [id number]  Admit date - 07/09/2015   Admitting Physician Toy Baker, MD  Outpatient Primary MD for the patient is [name], MD  LOS - 1  Outpatient Specialists: none  Chief Complaint  Patient presents with  . Fever  . Altered Mental Status       Brief Narrative   82 year old male with history of coronary artery disease, A. fib on Anticoagulation, rheumatoid arthritis,arthritis, chronic kidney disease stage III (creatinine of 2.4 months back in [place], worsened to 3.4 one month back) chronic anemia, lymphoma, recent fall with T12-L1 compression fracture, hypertension, who recently moved to the area from [place] presented with SIRS with temperature 102F and acute encephalopathy in the setting of lobar pneumonia and UTI.    Subjective:   Patient not in distress. Appears better oriented and answering question.   Assessment  & Plan :    Principle problems SIRS with acute encephalopathy Secondary to lobar pneumonia and UTI. Continue empiric Rocephin and azithromycin. Follow cultures.     Active Problems:  Acute on chronic kidney disease stage III- IV Creatinine of 2.4 from 11/2014 in [place]. (Scanned results in [place])  Patient recently established care with PCP in the community. Possibly worsened with SIRS and UTI. Avoid nephrotoxins and placed on gentle hydration. Check renal ultrasound. If unimproved consult nephrology.    Atrial fibrillation, permanent ([place])   Rate controlled. Coumadin performs. Continue amiodarone.    Essential hypertension Stable. Continue home medications.  Coronary artery  disease Continue beta blocker.    Iron deficiency anemia Transfuse as needed. Needs iron supplement upon discharge. Check stool for occult blood.  Sacral pressure ulcer Appreciate wound care evaluation.  BPH Continue Flomax  Recent fall with T12-L1 compression fracture PT evaluation. Needs to ensure patient is not at significant fall risk for ongoing anticoagulation use.      Code Status : DO NOT INTUBATE, no CPR but ok with ACLS medications  Family Communication  : None at bedside  Disposition Plan  :  Pending clinical improvement, PT evaluation. Possibly in the next 4772 hours  Barriers For Discharge : Active symptoms  Consults  :  None  Procedures  : None  DVT Prophylaxis  :  Subcutaneous heparin  Lab Results  Component Value Date   PLT 167 06/26/2015    Antibiotics  :   Anti-infectives    Start     Dose/Rate Route Frequency Ordered Stop   06/26/15 1800  azithromycin (ZITHROMAX) 500 mg in dextrose 5 % 250 mL IVPB     500 mg 250 mL/hr over 60 Minutes Intravenous Daily-1800 07/06/2015 2341 07/03/15 1759   06/26/15 0000  cefTRIAXone (ROCEPHIN) 1 g in dextrose 5 % 50 mL IVPB     1 g 100 mL/hr over 30 Minutes Intravenous Every 24 hours 07/20/2015 2341 07/02/15 2359   07/04/2015 1400  levofloxacin (LEVAQUIN) IVPB 500 mg     500 mg 100 mL/hr over 60 Minutes Intravenous  Once 06/22/2015 1356 06/26/2015 1505        Objective:   Filed Vitals:   06/24/2015 2201 06/26/15 0553 06/26/15 0842 06/26/15 1015  BP: 140/59 158/75 143/76 160/66  Pulse: 70 78 80 76  Temp: 99.2 F (37.3 C) 98.2 F (36.8 C) 98.6 F (37 C)   TempSrc: Oral Oral Oral   Resp: 20 22 18    Height:      Weight:      SpO2: 95% 91% 90%     Wt Readings from Last 3 Encounters:  06/22/2015 71.215 kg (157 lb)  06/07/15 72.689 kg (160 lb 4 oz)     Intake/Output Summary (Last 24 hours) at 06/26/15 1527 Last data filed at 06/26/15 1332  Gross per 24 hour  Intake 823.75 ml  Output    745 ml  Net  78.75  ml     Physical Exam  Gen: not in distress HEENT: no pallor, moist mucosa, supple neck Chest: clear b/l, no added sounds CVS: N S1&S2, no murmurs, rubs or gallop GI: soft, NT, ND, BS+ Musculoskeletal: warm, no edema CNS: AAOX3, non focal    Data Review:    CBC  Recent Labs Lab 07/12/2015 1330 06/26/15 0753  WBC 12.5* 8.0  HGB 9.2* 7.9*  HCT 28.4* 24.7*  PLT 224 167  MCV 93.7 92.2  MCH 30.4 29.5  MCHC 32.4 32.0  RDW 14.8 15.0  LYMPHSABS 0.6* 0.7  MONOABS 0.7 0.4  EOSABS 0.0 0.1  BASOSABS 0.0 0.0    Chemistries   Recent Labs Lab 07/15/2015 1330 07/16/2015 2256 06/26/15 0753  NA 135 136 139  K 5.3* 5.1 5.2*  CL 109 113* 114*  CO2 15* 17* 17*  GLUCOSE 110* 97 89  BUN 66* 66* 65*  CREATININE 4.23* 4.19* 4.18*  CALCIUM 8.0* 7.6* 7.8*  AST 22  --  22  ALT 17  --  18  ALKPHOS 45  --  41  BILITOT 1.0  --  0.9   ------------------------------------------------------------------------------------------------------------------ No results for input(s): CHOL, HDL, LDLCALC, TRIG, CHOLHDL, LDLDIRECT in the last 72 hours.  No results found for: HGBA1C ------------------------------------------------------------------------------------------------------------------ No results for input(s): TSH, T4TOTAL, T3FREE, THYROIDAB in the last 72 hours.  Invalid input(s): FREET3 ------------------------------------------------------------------------------------------------------------------  Recent Labs  06/26/15 0753  VITAMINB12 437  FOLATE 25.8  FERRITIN 509*  TIBC 143*  IRON 14*  RETICCTPCT 1.1    Coagulation profile  Recent Labs Lab 06/19/15 1616 06/21/15 1512 07/11/2015 1330  INR 4.1 3.8 1.56*    No results for input(s): DDIMER in the last  72 hours.  Cardiac Enzymes No results for input(s): CKMB, TROPONINI, MYOGLOBIN in the last 168 hours.  Invalid input(s):  CK ------------------------------------------------------------------------------------------------------------------ No results found for: BNP  Inpatient Medications  Scheduled Meds: . amiodarone  200 mg Oral Daily  . amLODipine  5 mg Oral Daily  . antiseptic oral rinse  7 mL Mouth Rinse BID  . atorvastatin  20 mg Oral Daily  . azithromycin  500 mg Intravenous q1800  . calcitRIOL  0.25 mcg Oral Daily  . cefTRIAXone (ROCEPHIN)  IV  1 g Intravenous Q24H  . guaiFENesin  600 mg Oral BID  . isosorbide mononitrate  30 mg Oral Daily  . metoprolol succinate  25 mg Oral Daily  . oxybutynin  10 mg Oral QHS  . pantoprazole  40 mg Oral Daily  . predniSONE  10 mg Oral Q breakfast  . tamsulosin  0.4 mg Oral Daily   Continuous Infusions: . sodium chloride 75 mL/hr at 06/26/15 1010   PRN Meds:.albuterol, oxyCODONE-acetaminophen, traMADol  Micro Results Recent Results (from the past 240 hour(s))  Culture, sputum-assessment     Status: None   Collection Time: 06/26/15  1:54 AM  Result Value Ref Range Status   Specimen Description EXPECTORATED SPUTUM  Final   Special Requests Immunocompromised  Final   Sputum evaluation   Final    THIS SPECIMEN IS ACCEPTABLE. RESPIRATORY CULTURE REPORT TO FOLLOW.   Report Status 06/26/2015 FINAL  Final  Culture, respiratory (NON-Expectorated)     Status: None (Preliminary result)   Collection Time: 06/26/15  1:54 AM  Result Value Ref Range Status   Specimen Description SPUTUM  Final   Special Requests NONE  Final   Gram Stain   Final    MODERATE WBC PRESENT,BOTH PMN AND MONONUCLEAR FEW GRAM POSITIVE COCCI IN PAIRS    Culture PENDING  Incomplete   Report Status PENDING  Incomplete    Radiology Reports Dg Chest 2 View  07/16/2015  CLINICAL DATA:  Fall 3 weeks ago.  Fever. EXAM: CHEST  2 VIEW COMPARISON:  None. FINDINGS: Mild cardiomegaly. There is infiltrate in the left perihilar region, extending into the left base with a small effusion. The left  hilum is largely obscured. The right hilum and mediastinum are unremarkable. No other acute abnormalities. IMPRESSION: Left lower lobe infiltrate with a small effusion. Given the history of fever, this likely represents pneumonia. However, recommend treatment with a follow-up chest x-ray in 2 or 3 weeks to ensure resolution. Electronically Signed   By: Dorise Bullion III M.D   On: 06/24/2015 13:29   Dg Ribs Unilateral Left  06/07/2015  CLINICAL DATA:  Contusion of the left posterior chest, left posterior lower rib pain EXAM: LEFT RIBS - 2 VIEW COMPARISON:  None. FINDINGS: There are fractures of the posterolateral left eighth, ninth, tenth rib and possibly the left posterolateral seventh rib. The bones are diffusely osteopenic. Left basilar atelectasis is noted in there may be a small left pleural effusion. No pneumothorax is seen. IMPRESSION: 1. Multiple left posterolateral lower rib fractures from approximately T7 or 8 to T10. 2. Osteopenia. 3. Left basilar atelectasis and possible small left effusion. Electronically Signed   By: Ivar Drape M.D.   On: 06/07/2015 15:59   Dg Thoracic Spine 2 View  06/27/2015  CLINICAL DATA:  Fall 3 weeks ago.  Compression fracture. EXAM: THORACIC SPINE 2 VIEWS COMPARISON:  Thoracic spine image 06/07/2015. Lumbar spine images performed today. FINDINGS: The T12 and L1 compression fractures are better seen on  today's lumbar spine series. No additional acute bony abnormality or malalignment. IMPRESSION: Mild T12 and L1 compression fractures, better seen on today's lumbar spine series. Electronically Signed   By: Rolm Baptise M.D.   On: 07/19/2015 13:34   Dg Thoracic Spine W/swimmers  06/07/2015  CLINICAL DATA:  Lower back and left posterior rib pain, chest wall contusion EXAM: THORACIC SPINE - 3 VIEWS COMPARISON:  Dictated report from chest x-ray of 04/25/2013 FINDINGS: The bones are diffusely osteopenic. Median sternotomy sutures are noted. There does appear to be a partial  compression deformity of T12 vertebral body of approximately 30%. This compression deformity was not mentioned on the dictated report from 2015. No retropulsion is seen. Intervertebral disc spaces appear normal. IMPRESSION: 30% compression deformity of T12 vertebral body of uncertain age. No significant retropulsion. Electronically Signed   By: Ivar Drape M.D.   On: 06/07/2015 15:57   Dg Lumbar Spine Complete  07/05/2015  CLINICAL DATA:  Fall 3 weeks ago with compression fracture and rib fractures. Persistent pain. EXAM: LUMBAR SPINE - COMPLETE 4+ VIEW COMPARISON:  Thoracic spine images 06/07/2015 FINDINGS: There are mild compression fractures involving the T12 and L1 vertebral bodies with approximately 30% loss of anterior vertebral body height. T12 appear stable since prior study. The compression fracture at L1 is stable or progressed since prior study. No additional acute bony abnormality. Degenerative facet disease in the lower lumbar spine. Diffuse aortoiliac atherosclerosis. Slight dilatation of the distal abdominal aorta which measures up to 3.2 cm. IMPRESSION: Mild compression fractures involving the T12 and L1 vertebral bodies. Mildly aneurysmal dilatation of the distal abdominal aorta, 3.2 cm by plain film. Electronically Signed   By: Rolm Baptise M.D.   On: 07/07/2015 13:31   Ct Head Wo Contrast  07/12/2015  CLINICAL DATA:  Fever.  Altered mental status.  Recent fall. EXAM: CT HEAD WITHOUT CONTRAST TECHNIQUE: Contiguous axial images were obtained from the base of the skull through the vertex without intravenous contrast. COMPARISON:  None. FINDINGS: Partially motion degraded study. Simple circumscribed 3.2 x 1.6 cm subcutaneous simple fluid density structure in the left inferior occipital scalp, consistent with a sebaceous cyst. No evidence of parenchymal hemorrhage or extra-axial fluid collection. No mass lesion, mass effect, or midline shift. No CT evidence of acute infarction. Intracranial  atherosclerosis. Generalized cerebral volume loss. Nonspecific moderate subcortical and periventricular white matter hypodensity, most in keeping with chronic small vessel ischemic change. No ventriculomegaly. The visualized paranasal sinuses are essentially clear. The right mastoid air cells are unopacified. Near complete left mastoid effusion. No evidence of calvarial fracture. IMPRESSION: 1. No evidence of acute intracranial abnormality. No evidence of calvarial fracture. 2. Generalized cerebral volume loss and moderate chronic small vessel ischemia. 3. Near complete left mastoid effusion, probably inflammatory. Electronically Signed   By: Ilona Sorrel M.D.   On: 07/18/2015 13:26    Time Spent in minutes  25   Louellen Molder M.D on 06/26/2015 at 3:27 PM  Between 7am to 7pm - Pager - [phone]  After 7pm go to [email or web address] - password [facility]  [facility] -  Office  [phone]

## 2015-06-26 NOTE — Care Management Important Message (Signed)
Important Message  Patient Details  Name: George Noble MRN: [id number] Date of Birth: [birth year]   Medicare Important Message Given:  Yes    Loann Quill 06/26/2015, 8:29 AM

## 2015-06-26 NOTE — Progress Notes (Signed)
ANTICOAGULATION CONSULT NOTE - Follow Up Consult  Pharmacy Consult for Warfarin Indication: atrial fibrillation  Allergies  Allergen Reactions  . Penicillins Other (See Comments)    Unsure of reaction  . Sulfa Antibiotics Other (See Comments)    Unsure of reaction    Patient Measurements: Height: 6' (182.9 cm) Weight: 157 lb (71.215 kg) IBW/kg (Calculated) : 77.6 Heparin Dosing Weight: 71 kg  Vital Signs: Temp: 98.6 F (37 C) (06/06 0842) Temp Source: Oral (06/06 0842) BP: 160/66 mmHg (06/06 1015) Pulse Rate: 76 (06/06 1015)  Labs:  Recent Labs  06/24/2015 1330 07/02/2015 2256 06/26/15 0753  HGB 9.2*  --  7.9*  HCT 28.4*  --  24.7*  PLT 224  --  167  APTT 37  --   --   LABPROT 18.7*  --   --   INR 1.56*  --   --   CREATININE 4.23* 4.19* 4.18*    Estimated Creatinine Clearance: 12.3 mL/min (by C-G formula based on Cr of 4.18).   Assessment: 25 YOM who presented on 6/5 with fever and confusion. The patient was on warfarin PTA for history of Afib - pharmacy consulted to resume this admission. Admit INR 1.56 on PTA dose of 1.5 mg/day.  No INR checked today - the patient's Hgb is down to 7.9 << 9.2, plts 167. The patient has known lymphoma with anemia requiring intermittent transfusions. The patient is on Rocephin + Azithromycin for CAP coverage. Eating 25-50% of meals.   Goal of Therapy:  INR 2-3   Plan:  1. Warfarin 2.5 mg x 1 dose at 1800 today 2. Daily PT/INR 3. Will continue to monitor for any signs/symptoms of bleeding and will follow up with PT/INR in the a.m.   Alycia Rossetti, PharmD, BCPS Clinical Pharmacist Pager: [phone] 06/26/2015 3:58 PM

## 2015-06-27 DIAGNOSIS — E43 Unspecified severe protein-calorie malnutrition: Secondary | ICD-10-CM

## 2015-06-27 LAB — URINE CULTURE

## 2015-06-27 LAB — BASIC METABOLIC PANEL
Anion gap: 12 (ref 5–15)
BUN: 59 mg/dL — AB (ref 6–20)
CHLORIDE: 112 mmol/L — AB (ref 101–111)
CO2: 15 mmol/L — AB (ref 22–32)
Calcium: 8 mg/dL — ABNORMAL LOW (ref 8.9–10.3)
Creatinine, Ser: 4.03 mg/dL — ABNORMAL HIGH (ref 0.61–1.24)
GFR calc Af Amer: 14 mL/min — ABNORMAL LOW (ref >60)
GFR calc non Af Amer: 12 mL/min — ABNORMAL LOW (ref >60)
GLUCOSE: 95 mg/dL (ref 65–99)
POTASSIUM: 4.9 mmol/L (ref 3.5–5.1)
Sodium: 139 mmol/L (ref 135–145)

## 2015-06-27 LAB — PROTIME-INR
INR: 1.85 — ABNORMAL HIGH (ref 0.00–1.49)
PROTHROMBIN TIME: 21.3 s — AB (ref 11.6–15.2)

## 2015-06-27 LAB — LEGIONELLA PNEUMOPHILA SEROGP 1 UR AG: L. pneumophila Serogp 1 Ur Ag: NEGATIVE

## 2015-06-27 LAB — CBC
HCT: 24.1 % — ABNORMAL LOW (ref 39.0–52.0)
HEMOGLOBIN: 7.7 g/dL — AB (ref 13.0–17.0)
MCH: 28.9 pg (ref 26.0–34.0)
MCHC: 32 g/dL (ref 30.0–36.0)
MCV: 90.6 fL (ref 78.0–100.0)
Platelets: 166 10*3/uL (ref 150–400)
RBC: 2.66 MIL/uL — AB (ref 4.22–5.81)
RDW: 14.8 % (ref 11.5–15.5)
WBC: 8.6 10*3/uL (ref 4.0–10.5)

## 2015-06-27 MED ORDER — HEPARIN SODIUM (PORCINE) 5000 UNIT/ML IJ SOLN
5000.0000 [IU] | Freq: Three times a day (TID) | INTRAMUSCULAR | Status: DC
  Administered 2015-06-27 – 2015-06-28 (×3): 5000 [IU] via SUBCUTANEOUS
  Filled 2015-06-27 (×2): qty 1

## 2015-06-27 MED ORDER — FUROSEMIDE 80 MG PO TABS
80.0000 mg | ORAL_TABLET | Freq: Every day | ORAL | Status: DC
  Administered 2015-06-27: 80 mg via ORAL
  Filled 2015-06-27: qty 1

## 2015-06-27 MED ORDER — LIDOCAINE 5 % EX PTCH
2.0000 | MEDICATED_PATCH | CUTANEOUS | Status: DC
  Administered 2015-06-28 – 2015-06-30 (×3): 2 via TRANSDERMAL
  Filled 2015-06-27 (×3): qty 2

## 2015-06-27 MED ORDER — BISACODYL 10 MG RE SUPP: 10.0000 mg | Freq: Every day | RECTAL | Status: DC | PRN

## 2015-06-27 MED ORDER — SODIUM CHLORIDE 0.9 % IV SOLN
510.0000 mg | Freq: Once | INTRAVENOUS | Status: AC
  Administered 2015-06-27: 510 mg via INTRAVENOUS
  Filled 2015-06-27: qty 17

## 2015-06-27 MED ORDER — OXYCODONE HCL 5 MG PO TABS
5.0000 mg | ORAL_TABLET | Freq: Four times a day (QID) | ORAL | Status: DC | PRN
  Administered 2015-06-27: 5 mg via ORAL
  Filled 2015-06-27: qty 1

## 2015-06-27 MED ORDER — NEPRO/CARBSTEADY PO LIQD
237.0000 mL | Freq: Two times a day (BID) | ORAL | Status: DC
Start: 2015-06-27 — End: 2015-06-30
  Administered 2015-06-27: 237 mL via ORAL
  Filled 2015-06-27 (×7): qty 237

## 2015-06-27 MED ORDER — ACETAMINOPHEN 325 MG PO TABS: 650.0000 mg | ORAL_TABLET | Freq: Four times a day (QID) | ORAL | Status: DC | PRN

## 2015-06-27 MED ORDER — SODIUM BICARBONATE 650 MG PO TABS
1300.0000 mg | ORAL_TABLET | Freq: Two times a day (BID) | ORAL | Status: DC
  Administered 2015-06-27 – 2015-06-28 (×3): 1300 mg via ORAL
  Filled 2015-06-27 (×4): qty 2

## 2015-06-27 MED ORDER — SENNA 8.6 MG PO TABS
2.0000 | ORAL_TABLET | Freq: Every day | ORAL | Status: DC
  Administered 2015-06-27 – 2015-06-28 (×2): 17.2 mg via ORAL
  Filled 2015-06-27 (×2): qty 2

## 2015-06-27 MED ORDER — WARFARIN SODIUM 1 MG PO TABS
1.0000 mg | ORAL_TABLET | Freq: Once | ORAL | Status: DC
  Filled 2015-06-27: qty 1

## 2015-06-27 MED ORDER — BISACODYL 10 MG RE SUPP: 40.0000 mg | Freq: Once | RECTAL | Status: DC

## 2015-06-27 MED ORDER — LIDOCAINE 5 % EX PTCH
1.0000 | MEDICATED_PATCH | CUTANEOUS | Status: DC
  Administered 2015-06-27: 1 via TRANSDERMAL
  Filled 2015-06-27: qty 1

## 2015-06-27 MED ORDER — BISACODYL 10 MG RE SUPP
10.0000 mg | RECTAL | Status: DC
  Filled 2015-06-27: qty 1

## 2015-06-27 MED ORDER — ASPIRIN EC 81 MG PO TBEC
81.0000 mg | DELAYED_RELEASE_TABLET | Freq: Every day | ORAL | Status: DC
  Administered 2015-06-28: 81 mg via ORAL
  Filled 2015-06-27: qty 1

## 2015-06-27 MED ORDER — DARBEPOETIN ALFA 25 MCG/0.42ML IJ SOSY
25.0000 ug | PREFILLED_SYRINGE | Freq: Once | INTRAMUSCULAR | Status: AC
  Administered 2015-06-27: 25 ug via SUBCUTANEOUS
  Filled 2015-06-27: qty 0.42

## 2015-06-27 MED ORDER — TRAMADOL HCL 50 MG PO TABS
50.0000 mg | ORAL_TABLET | Freq: Two times a day (BID) | ORAL | Status: DC | PRN
  Administered 2015-06-28: 50 mg via ORAL
  Filled 2015-06-27: qty 1

## 2015-06-27 MED ORDER — BISACODYL 10 MG RE SUPP
10.0000 mg | Freq: Every day | RECTAL | Status: DC
  Administered 2015-06-27: 10 mg via RECTAL
  Filled 2015-06-27 (×2): qty 1

## 2015-06-27 MED ORDER — POLYETHYLENE GLYCOL 3350 17 G PO PACK
17.0000 g | PACK | Freq: Two times a day (BID) | ORAL | Status: DC
  Administered 2015-06-27: 17 g via ORAL
  Filled 2015-06-27 (×3): qty 1

## 2015-06-27 NOTE — NC FL2 (Signed)
[place] LEVEL OF CARE SCREENING TOOL     IDENTIFICATION  Patient Name: George Noble Birthdate: 11-16-1924 Sex: male Admission Date (Current Location): 07/04/2015  [facility] and [place] Number:  Herbalist and Address:  [facility] [place]. [facility] [place] [address] [place], [place] [address]      Provider Number: [id number]  Attending Physician Name and Address:  Janece Canterbury, MD  Relative Name and Phone Number:  Lianne Bushy - daughter. Phone [phone].    Current Level of Care: Hospital Recommended Level of Care: [place] Prior Approval Number:    Date Approved/Denied:   PASRR Number: [id number] (Eff. 06/27/15)  Discharge Plan: SNF    Current Diagnoses: Patient Active Problem List   Diagnosis Date Noted  . Protein-calorie malnutrition, severe 06/27/2015  . Pressure ulcer 06/26/2015  . UTI (lower urinary tract infection) 07/05/2015  . CAP (community acquired pneumonia) 07/09/2015  . Acute encephalopathy 07/10/2015  . CAD (coronary artery disease), native coronary artery 07/18/2015  . Acute on chronic renal failure ([place]) 07/19/2015  . HTN (hypertension) 06/10/2015  . Lymphoma ([place]) 06/10/2015  . Anemia, iron deficiency 06/10/2015  . PCP NOTES >>>>>>>>>>>>>>>>>>>>> 06/10/2015  . Atrial fibrillation, permanent ([place])   . Hyperlipidemia   . Chronic kidney disease   . Seropositive rheumatoid arthritis of multiple sites ([place]) 11/20/2013    Orientation RESPIRATION BLADDER Height & Weight     Self  Normal Incontinent Weight: 157 lb (71.215 kg) Height:  6' (182.9 cm)  BEHAVIORAL SYMPTOMS/MOOD NEUROLOGICAL BOWEL NUTRITION STATUS      Continent Diet (Renal with 1200 mL fluid restriction)  AMBULATORY STATUS COMMUNICATION OF NEEDS Skin    Limited Assistance Verbally Other (Comment) (Stage 2 pressure injury to bilat buttocks/sacrum area. Foam dressing to buttocks, change Q 5 days or PRN soiling. If dressing is becoming  frequently saturated, then leave off and apply barrier cream PRN with each turning and cleaning episode.)                Patient ambulated 12 feet with rolling walker on 06/27/15.       Personal Care Assistance Level of Assistance  Bathing, Feeding, Dressing   Feeding assistance: Independent  Bathing - Limited assistance Dressing - Limited assistance       Functional Limitations Info  Sight, Hearing, Speech Sight Info: Adequate Hearing Info: Impaired Speech Info: Adequate    SPECIAL CARE FACTORS FREQUENCY  PT (By licensed PT)     PT Frequency: Evaluated 06/27/15  A minimum of 3X per week therapy recommended              Contractures Contractures Info: Not present    Additional Factors Info  Code Status, Allergies Code Status Info: Partial Code Allergies Info: Penicillins, Sulfa Antibiotics           Current Medications (06/27/2015):  This is the current hospital active medication list Current Facility-Administered Medications  Medication Dose Route Frequency Provider Last Rate Last Dose  . albuterol (PROVENTIL) (2.5 MG/3ML) 0.083% nebulizer solution 2.5 mg  2.5 mg Nebulization Q4H PRN Toy Baker, MD      . amiodarone (PACERONE) tablet 200 mg  200 mg Oral Daily Toy Baker, MD   200 mg at 06/27/15 1040  . amLODipine (NORVASC) tablet 5 mg  5 mg Oral Daily Toy Baker, MD   5 mg at 06/27/15 1039  . antiseptic oral rinse (CPC / CETYLPYRIDINIUM CHLORIDE 0.05%) solution 7 mL  7 mL Mouth Rinse BID Anastassia  Doutova, MD   7 mL at 06/27/15 1000  . atorvastatin (LIPITOR) tablet 20 mg  20 mg Oral Daily Toy Baker, MD   20 mg at 06/27/15 1040  . azithromycin (ZITHROMAX) 500 mg in dextrose 5 % 250 mL IVPB  500 mg Intravenous q1800 Toy Baker, MD   500 mg at 06/26/15 1824  . calcitRIOL (ROCALTROL) capsule 0.25 mcg  0.25 mcg Oral Daily Toy Baker, MD   0.25 mcg at 06/27/15 1039  . cefTRIAXone (ROCEPHIN) 1 g in dextrose 5 % 50 mL IVPB   1 g Intravenous Q24H Toy Baker, MD   1 g at 06/26/15 2342  . feeding supplement (NEPRO CARB STEADY) liquid 237 mL  237 mL Oral BID BM Janece Canterbury, MD   237 mL at 06/27/15 1430  . guaiFENesin (MUCINEX) 12 hr tablet 600 mg  600 mg Oral BID Toy Baker, MD   600 mg at 06/27/15 1039  . isosorbide mononitrate (IMDUR) 24 hr tablet 30 mg  30 mg Oral Daily Toy Baker, MD   30 mg at 06/27/15 1039  . lidocaine (LIDODERM) 5 % 1 patch  1 patch Transdermal Q24H Janece Canterbury, MD   1 patch at 06/27/15 1325  . metoprolol succinate (TOPROL-XL) 24 hr tablet 25 mg  25 mg Oral Daily Toy Baker, MD   25 mg at 06/27/15 1039  . oxybutynin (DITROPAN-XL) 24 hr tablet 10 mg  10 mg Oral QHS Toy Baker, MD   10 mg at 06/26/15 2342  . oxyCODONE-acetaminophen (PERCOCET) 7.5-325 MG per tablet 1 tablet  1 tablet Oral Q8H PRN Toy Baker, MD   1 tablet at 06/27/15 0647  . pantoprazole (PROTONIX) EC tablet 40 mg  40 mg Oral Daily Toy Baker, MD   40 mg at 06/27/15 1040  . predniSONE (DELTASONE) tablet 10 mg  10 mg Oral Q breakfast Toy Baker, MD   10 mg at 06/27/15 0821  . tamsulosin (FLOMAX) capsule 0.4 mg  0.4 mg Oral Daily Toy Baker, MD   0.4 mg at 06/27/15 1039  . traMADol (ULTRAM) tablet 50 mg  50 mg Oral Q12H PRN Toy Baker, MD   50 mg at 06/26/15 0857  . Warfarin - Pharmacist Dosing Inpatient   Does not apply [place], [facility]         Discharge Medications: Please see discharge summary for a list of discharge medications.  Relevant Imaging Results:  Relevant Lab Results:   Additional Information SS# [id number].,  Sable Feil, LCSW

## 2015-06-27 NOTE — Progress Notes (Signed)
MEDICATION RELATED CONSULT NOTE - INITIAL   Pharmacy Consult for Aranesp Indication: Anemia CKD  Allergies  Allergen Reactions  . Penicillins Other (See Comments)    Unsure of reaction  . Sulfa Antibiotics Other (See Comments)    Unsure of reaction    Patient Measurements: Height: 6' (182.9 cm) Weight: 157 lb (71.215 kg) IBW/kg (Calculated) : 77.6 Adjusted Body Weight:   Vital Signs: Temp: 98 F (36.7 C) (06/07 2002) Temp Source: Oral (06/07 1632) BP: 134/66 mmHg (06/07 2002) Pulse Rate: 80 (06/07 2002) Intake/Output from previous day: 06/06 0701 - 06/07 0700 In: 2710 [P.O.:560; I.V.:2100; IV Piggyback:50] Out: 1295 [Urine:1295] Intake/Output from this shift: Total I/O In: -  Out: 200 [Urine:200]  Labs:  Recent Labs  07/03/2015 1330 07/17/2015 2256 06/26/15 0753 06/26/15 1946 06/27/15 0655  WBC 12.5*  --  8.0  --  8.6  HGB 9.2*  --  7.9*  --  7.7*  HCT 28.4*  --  24.7*  --  24.1*  PLT 224  --  167  --  166  APTT 37  --   --   --   --   CREATININE 4.23* 4.19* 4.18*  --  4.03*  LABCREA  --   --   --  44.19  --   ALBUMIN 2.8*  --  2.1*  --   --   PROT 5.8*  --  4.7*  --   --   AST 22  --  22  --   --   ALT 17  --  18  --   --   ALKPHOS 45  --  41  --   --   BILITOT 1.0  --  0.9  --   --    Estimated Creatinine Clearance: 12.8 mL/min (by C-G formula based on Cr of 4.03).   Microbiology:   Medical History: Past Medical History  Diagnosis Date  . Seropositive rheumatoid arthritis of multiple sites ([place]) 11/2013  . Inflammatory polyarthropathy ([place])   . Low grade B-cell lymphoma ([place])   . CAD (coronary artery disease)   . Myocardial infarction [facility]) 1995    inferior, multivessel CABG  . H/O abdominal aortic aneurysm   . Atrial fibrillation, permanent ([place])   . Hyperlipidemia   . Valvular insufficiency   . Complete left bundle branch block (LBBB)     on EKG  . GERD (gastroesophageal reflux disease)   . Anemia     low iron stores  . Hypertension    . History of chicken pox   . Chronic kidney disease     Medications:  Prescriptions prior to admission  Medication Sig Dispense Refill Last Dose  . amiodarone (PACERONE) 200 MG tablet Take 200 mg by mouth daily.   Past Week at Unknown time  . amLODipine (NORVASC) 5 MG tablet Take 5 mg by mouth daily.   Past Week at Unknown time  . atorvastatin (LIPITOR) 20 MG tablet Take 20 mg by mouth daily.   Past Week at Unknown time  . calcitRIOL (ROCALTROL) 0.25 MCG capsule Take 1 capsule (0.25 mcg total) by mouth daily. 30 capsule 3 Past Week at Unknown time  . ERGOCALCIFEROL PO Take 1.25 mg by mouth once a week.   Past Week at Unknown time  . isosorbide mononitrate (IMDUR) 30 MG 24 hr tablet Take 30 mg by mouth daily.   Past Week at Unknown time  . metoprolol succinate (TOPROL-XL) 25 MG 24 hr tablet Take 25 mg by mouth daily.  06/24/2015 at 1730  . Multiple Vitamins-Minerals (PRESERVISION AREDS 2) CAPS Take by mouth daily. Reported on 06/07/2015   Past Week at Unknown time  . omeprazole (PRILOSEC) 20 MG capsule Take 1 capsule (20 mg total) by mouth daily. 30 capsule 3 Past Week at Unknown time  . oxybutynin (DITROPAN-XL) 10 MG 24 hr tablet Take 10 mg by mouth at bedtime.   Past Week at Unknown time  . oxyCODONE-acetaminophen (PERCOCET) 7.5-325 MG tablet Take 1 tablet by mouth every 8 (eight) hours as needed for severe pain. 60 tablet 0 Past Week at Unknown time  . predniSONE (DELTASONE) 10 MG tablet Take 10 mg by mouth daily with breakfast.   Past Week at Unknown time  . tamsulosin (FLOMAX) 0.4 MG CAPS capsule Take 0.4 mg by mouth daily.   Past Week at Unknown time  . Tofacitinib Citrate (XELJANZ) 5 MG TABS Take 1 tablet by mouth daily.   Past Week at Unknown time  . traMADol (ULTRAM) 50 MG tablet Take 0.5-1 tablets (25-50 mg total) by mouth every 12 (twelve) hours as needed. (Patient taking differently: Take 25-50 mg by mouth every 12 (twelve) hours as needed for moderate pain. ) 30 tablet 0 Past Month  at Unknown time  . warfarin (COUMADIN) 3 MG tablet Take 1.5 mg by mouth daily.    Past Week at Unknown time    Assessment: 82 y/o with CKD3 has low Iron (14), TIBC (143), Sats (10), and increased ferritin (508). Patient's Hgb has decreased from 9.5 on May 18 to 7.7 today.  Goal of Therapy:  Improvement in anemia  Plan:  Ferraheme 510mg  IV x 1 tonight by MD. Julienne Kass Aranesp 0.45 mcg/kg SQ once every 4 wks as needed. Pharmacy will sign off. Please reconsult for further dosing assitance.    Bernetta Sutley S. Alford Highland, PharmD, BCPS Clinical Staff Pharmacist Pager [phone]  Eilene Ghazi Stillinger 06/27/2015,9:52 PM

## 2015-06-27 NOTE — Progress Notes (Signed)
PROGRESS NOTE  George Noble  [id number] DOB: 1924/08/13 DOA: 06/24/2015 PCP: [name], MD  Brief Narrative:   82 year old male with history of coronary artery disease, A. fib on Anticoagulation, rheumatoid arthritis,arthritis, chronic kidney disease stage III (creatinine of 2.4 months back in [place], worsened to 3.4 one month back) chronic anemia, lymphoma, recent fall with T12-L1 compression fracture, hypertension, who recently moved to the area from [place] presented with SIRS with temperature 102F and acute encephalopathy in the setting of lobar pneumonia and UTI.  Assessment & Plan:   Active Problems:   Atrial fibrillation, permanent (HCC)   Seropositive rheumatoid arthritis of multiple sites ([place])   Chronic kidney disease   HTN (hypertension)   Anemia, iron deficiency   UTI (lower urinary tract infection)   CAP (community acquired pneumonia)   Acute encephalopathy   CAD (coronary artery disease), native coronary artery   Acute on chronic renal failure (HCC)   Pressure ulcer   Protein-calorie malnutrition, severe   SIRS with acute encephalopathy secondary to lobar pneumonia -  Leukocytosis and fever resolving -  Continue Rocephin and azithromycin -  BCx NGTD  Active Problems: Acute on chronic kidney disease stage III- IV, creatinine trended down slightly with IVF, but now edematous and wheezy Creatinine of 2.4 from 11/2014 in [place] and more recent creatinine has ranged 3.5-3.7.  Metabolic acidosis Avoid nephrotoxins and placed on gentle hydration Renal ultrasound: polycystic kidneys with evidence of medical renal disease Urology follow up for BPH Start sodium bicarbonate Check phosporus Continue calcitriol  Atrial fibrillation, permanent ([place]), CHADS2VASC 4, anticoagulation indicated Rate controlled, Continue amiodarone. Partner discussed risks and benefits of a/c with family at admission and family elected to stop warfarin for now.  Will confirm  with family Hold warfarin this evening and consider aspirin   Essential hypertension, blood pressures mildly elevated - continue beta blocker, norvasc  Coronary artery disease Continue beta blocker, statin, imdur Consider aspirin Add   Iron deficiency anemia and anemia of chronic renal disease, folate and B12 wnl.  Iron level low, ferritin elevated from infection and RA -  TSH -  Occult stool -  Repeat hgb in AM  -  Ferrous sulfate at discharge  Sacral pressure ulcer Appreciate wound care evaluation.  BPH with evidence of urinary retention on RUS.  Still voiding  Continue Flomax D/c oxybutynin Urology follow up  Constipation -  Bisacodyl suppository -  Start miralax BID -  Senna -  Soap suds enema if bisacodyl suppository doesn't work  Recent fall with T12-L1 compression fracture PT/OT evaluation > SNF SW consult Lidocaine patch to area  Seropositive RA -  Continue prednisone -  Continue ultram and oxycodone  Severe protein calorie malnutrition -  Regular diet with supplements, appreciate nutrition assistance  DVT prophylaxis:  heparin Code Status:  Partial code Family Communication:  Patient alone Disposition Plan:  To SNF in 2 days, likely Friday.    Consultants:   None  Procedures:  none  Antimicrobials:   Ceftriaxone 6/5 >   Azithromcyin 6/5 >   Levofloxacin 6/5 x 1   Subjective:  Coughing up blood tinged sputum, but denies SOB.  Bothered most by the pain in his back, but cannot tell me why it hurts (compression fractures from recent fall).  States he is eating well.  Difficulty urinating.  No BMs in a week   Objective: Filed Vitals:   06/27/15 0553 06/27/15 0728 06/27/15 1221 06/27/15 1632  BP: 124/87 159/71 149/64 154/66  Pulse: 84 82 83 80  Temp: 98.6 F (37 C) 98.2 F (36.8 C) 98.3 F (36.8 C) 98.2 F (36.8 C)  TempSrc: Oral Oral Oral Oral  Resp: 16 18 19 18   Height:      Weight:      SpO2: 90% 91% 90% 92%    Intake/Output  Summary (Last 24 hours) at 06/27/15 1725 Last data filed at 06/27/15 1426  Gross per 24 hour  Intake 3292.5 ml  Output    870 ml  Net 2422.5 ml   Filed Weights   07/01/2015 1209 07/18/2015 1901  Weight: 66.225 kg (146 lb) 71.215 kg (157 lb)    Examination:  General exam:  Cachectic adult male, No acute distress.  HEENT:  NCAT, MMM Respiratory system:  Wheezing with rales at bilateral bases, no rhonchi, frequent cough Cardiovascular system:  IRRR with 2/6 systolic murmur at the LSB.  Warm extremities Gastrointestinal system: Normal active bowel sounds, soft, moderately distended, mildly TTP without rebound or guarding. MSK:  Normal tone and bulk, 1+ pitting bilateral lower extremity edema Neuro:  Grossly moves all extremities Psych:  Confused.    Data Reviewed: I have personally reviewed following labs and imaging studies  CBC:  Recent Labs Lab 06/29/2015 1330 06/26/15 0753 06/27/15 0655  WBC 12.5* 8.0 8.6  NEUTROABS 11.3* 6.8  --   HGB 9.2* 7.9* 7.7*  HCT 28.4* 24.7* 24.1*  MCV 93.7 92.2 90.6  PLT 224 167 [id number]   Basic Metabolic Panel:  Recent Labs Lab 07/10/2015 1330 07/11/2015 2256 06/26/15 0753 06/27/15 0655  NA 135 136 139 139  K 5.3* 5.1 5.2* 4.9  CL 109 113* 114* 112*  CO2 15* 17* 17* 15*  GLUCOSE 110* 97 89 95  BUN 66* 66* 65* 59*  CREATININE 4.23* 4.19* 4.18* 4.03*  CALCIUM 8.0* 7.6* 7.8* 8.0*   GFR: Estimated Creatinine Clearance: 12.8 mL/min (by C-G formula based on Cr of 4.03). Liver Function Tests:  Recent Labs Lab 06/22/2015 1330 06/26/15 0753  AST 22 22  ALT 17 18  ALKPHOS 45 41  BILITOT 1.0 0.9  PROT 5.8* 4.7*  ALBUMIN 2.8* 2.1*   No results for input(s): LIPASE, AMYLASE in the last 168 hours. No results for input(s): AMMONIA in the last 168 hours. Coagulation Profile:  Recent Labs Lab 06/21/15 1512 07/19/2015 1330 06/27/15 0655  INR 3.8 1.56* 1.85*   Cardiac Enzymes: No results for input(s): CKTOTAL, CKMB, CKMBINDEX, TROPONINI in  the last 168 hours. BNP (last 3 results) No results for input(s): PROBNP in the last 8760 hours. HbA1C: No results for input(s): HGBA1C in the last 72 hours. CBG: No results for input(s): GLUCAP in the last 168 hours. Lipid Profile: No results for input(s): CHOL, HDL, LDLCALC, TRIG, CHOLHDL, LDLDIRECT in the last 72 hours. Thyroid Function Tests: No results for input(s): TSH, T4TOTAL, FREET4, T3FREE, THYROIDAB in the last 72 hours. Anemia Panel:  Recent Labs  06/26/15 0753  VITAMINB12 437  FOLATE 25.8  FERRITIN 509*  TIBC 143*  IRON 14*  RETICCTPCT 1.1   Urine analysis:    Component Value Date/Time   COLORURINE YELLOW 07/08/2015 1255   APPEARANCEUR TURBID* 07/03/2015 1255   LABSPEC 1.014 06/27/2015 1255   PHURINE 8.0 07/08/2015 1255   GLUCOSEU NEGATIVE 06/27/2015 1255   HGBUR TRACE* 07/20/2015 1255   [place] 07/20/2015 1255   [place] 06/28/2015 1255   PROTEINUR >300* 07/03/2015 1255   NITRITE NEGATIVE 06/28/2015 1255   LEUKOCYTESUR SMALL* 06/24/2015 1255   Sepsis  Labs: @LABRCNTIP (procalcitonin:4,lacticidven:4)  ) Recent Results (from the past 240 hour(s))  Urine culture     Status: Abnormal   Collection Time: 06/24/2015 12:55 PM  Result Value Ref Range Status   Specimen Description URINE, CLEAN CATCH  Final   Special Requests NONE  Final   Culture MULTIPLE SPECIES PRESENT, SUGGEST RECOLLECTION (A)  Final   Report Status 06/27/2015 FINAL  Final  Culture, sputum-assessment     Status: None   Collection Time: 06/26/15  1:54 AM  Result Value Ref Range Status   Specimen Description EXPECTORATED SPUTUM  Final   Special Requests Immunocompromised  Final   Sputum evaluation   Final    THIS SPECIMEN IS ACCEPTABLE. RESPIRATORY CULTURE REPORT TO FOLLOW.   Report Status 06/26/2015 FINAL  Final  Culture, respiratory (NON-Expectorated)     Status: None (Preliminary result)   Collection Time: 06/26/15  1:54 AM  Result Value Ref Range Status    Specimen Description SPUTUM  Final   Special Requests NONE  Final   Gram Stain   Final    MODERATE WBC PRESENT,BOTH PMN AND MONONUCLEAR FEW GRAM POSITIVE COCCI IN PAIRS    Culture Consistent with normal respiratory flora.  Final   Report Status PENDING  Incomplete      Radiology Studies: US Renal  06/26/2015  CLINICAL DATA:  Acute renal failure EXAM: RENAL / URINARY TRACT ULTRASOUND COMPLETE COMPARISON:  None. FINDINGS: Right Kidney: Length: 12.0 cm. Multiple cysts are noted within the right kidney. There is a 2.7 cm cyst in the mid upper pole and a 18 2 point 5 cm cyst in the lower pole. Slight increased echogenicity is noted. No obstructive changes are noted. Left Kidney: Length: 14.8 cm. Multiple cysts are seen. The largest of these lies in the lower pole measuring 7 cm. A smaller 2.6 cm cyst is noted in the upper pole. Increased echogenicity is noted. No obstructive changes are noted. Bladder: Well distended. A a focal soft tissue lesion is identified in the inferior aspect of the bladder likely representing the prostate gland. The possibility of an underlying bladder mass could not be totally excluded however. IMPRESSION: Bilateral renal cystic change. Increased echogenicity consistent with medical renal disease. Likely prostate indenting on the inferior aspect of the bladder although the possibility of a bladder mass could not be totally excluded. Direct visualization is recommended. Electronically Signed   By: Inez Catalina M.D.   On: 06/26/2015 18:17     Scheduled Meds: . amiodarone  200 mg Oral Daily  . amLODipine  5 mg Oral Daily  . antiseptic oral rinse  7 mL Mouth Rinse BID  . atorvastatin  20 mg Oral Daily  . azithromycin  500 mg Intravenous q1800  . calcitRIOL  0.25 mcg Oral Daily  . cefTRIAXone (ROCEPHIN)  IV  1 g Intravenous Q24H  . feeding supplement (NEPRO CARB STEADY)  237 mL Oral BID BM  . guaiFENesin  600 mg Oral BID  . heparin subcutaneous  5,000 Units Subcutaneous Q8H    . isosorbide mononitrate  30 mg Oral Daily  . lidocaine  1 patch Transdermal Q24H  . metoprolol succinate  25 mg Oral Daily  . oxybutynin  10 mg Oral QHS  . pantoprazole  40 mg Oral Daily  . predniSONE  10 mg Oral Q breakfast  . tamsulosin  0.4 mg Oral Daily   Continuous Infusions:    LOS: 2 days    Time spent: 30 min    Greely Atiyeh, MD [facility] Pager [phone]  If 7PM-7AM, please contact night-coverage [email or web address] Password [email or web address] 06/27/2015, 5:25 PM

## 2015-06-27 NOTE — Evaluation (Signed)
Physical Therapy Evaluation Patient Details Name: George Noble MRN: [id number] DOB: 1924/04/29 Today's Date: 06/27/2015   History of Present Illness  81 year old male with history of coronary artery disease, A. fib on Anticoagulation, rheumatoid arthritis,arthritis, chronic kidney disease stage III (creatinine of 2.4 months back in [place], worsened to 3.4 one month back) chronic anemia, lymphoma, recent fall with T12-L1 compression fracture, hypertension, who recently moved to the area from [place] presented with SIRS with temperature 102F and acute encephalopathy in the setting of lobar pneumonia and UTI.  Clinical Impression   Pt admitted with above diagnosis. Pt currently with functional limitations due to the deficits listed below (see PT Problem List).  Pt will benefit from skilled PT to increase their independence and safety with mobility to allow discharge to the venue listed below.       Follow Up Recommendations SNF    Equipment Recommendations  Rolling walker with 5" wheels;3in1 (PT)    Recommendations for Other Services       Precautions / Restrictions Precautions Precautions: Fall Precaution Comments: watch O2 sats      Mobility  Bed Mobility Overal bed mobility: Needs Assistance Bed Mobility: Supine to Sit     Supine to sit: Mod assist     General bed mobility comments: Mod assist to elevate trunk from sidelying to sit  Transfers Overall transfer level: Needs assistance Equipment used: Rolling walker (2 wheeled) Transfers: Sit to/from Stand Sit to Stand: Mod assist         General transfer comment: light mod assist to power up to sit  Ambulation/Gait Ambulation/Gait assistance: Min assist Ambulation Distance (Feet): 12 Feet Assistive device: Rolling walker (2 wheeled) Gait Pattern/deviations: Shuffle     General Gait Details: Cues and min assist for RW managementa nd use  Stairs            Wheelchair Mobility    Modified Rankin  (Stroke Patients Only)       Balance Overall balance assessment: Needs assistance           Standing balance-Leahy Scale: Poor                               Pertinent Vitals/Pain Pain Assessment: No/denies pain    Home Living Family/patient expects to be discharged to:: Skilled nursing facility Living Arrangements: Children                    Prior Function Level of Independence: Independent with assistive device(s)         Comments: PTA used RW     Hand Dominance        Extremity/Trunk Assessment   Upper Extremity Assessment: Generalized weakness           Lower Extremity Assessment: Generalized weakness         Communication   Communication: No difficulties  Cognition Arousal/Alertness: Awake/alert Behavior During Therapy: Impulsive Overall Cognitive Status: Impaired/Different from baseline Area of Impairment: Orientation Orientation Level: Disoriented to;Place;Time;Situation                  General Comments General comments (skin integrity, edema, etc.): Pt on O2 2 L upon arrival; O2 sats 80% with a good waveform, so increased O2 to 3 liters with modest imporvemnt in sats to 89%, then 4 liters, and O2 sats increased to 95%; RN notified    Exercises        Assessment/Plan  PT Assessment Patient needs continued PT services  PT Diagnosis Difficulty walking;Generalized weakness   PT Problem List Decreased strength;Decreased activity tolerance;Decreased balance;Decreased mobility;Decreased coordination;Decreased cognition;Decreased knowledge of use of DME;Decreased safety awareness;Decreased knowledge of precautions;Cardiopulmonary status limiting activity  PT Treatment Interventions DME instruction;Gait training;Functional mobility training;Therapeutic activities;Therapeutic exercise;Balance training;Patient/family education   PT Goals (Current goals can be found in the Care Plan section) Acute Rehab PT  Goals Patient Stated Goal: agreeable to OOB PT Goal Formulation: With patient Time For Goal Achievement: 07/11/15 Potential to Achieve Goals: Good    Frequency Min 3X/week   Barriers to discharge        Co-evaluation               End of Session Equipment Utilized During Treatment: Gait belt;Oxygen Activity Tolerance: Patient tolerated treatment well Patient left: in chair;with call bell/phone within reach;with chair alarm set;with family/visitor present Nurse Communication: Mobility status;Other (comment) (and low O2 sats)         Time: [id number] PT Time Calculation (min) (ACUTE ONLY): 34 min   Charges:   PT Evaluation $PT Eval Moderate Complexity: 1 Procedure PT Treatments $Gait Training: 8-22 mins   PT G Codes:        Quin Hoop 06/27/2015, 5:11 PM  Roney [place], [place] Pager [phone] Office [phone]

## 2015-06-27 NOTE — Progress Notes (Addendum)
Initial Nutrition Assessment  DOCUMENTATION CODES:   Severe malnutrition in context of chronic illness  INTERVENTION:  Provide Nepro Shake po BID, each supplement provides 425 kcal and 19 grams protein.  Encourage adequate PO intake.   NUTRITION DIAGNOSIS:   Malnutrition related to chronic illness as evidenced by severe depletion of body fat, severe depletion of muscle mass.  GOAL:   Patient will meet greater than or equal to 90% of their needs  MONITOR:   PO intake, Supplement acceptance, Weight trends, Labs, I & O's, Skin  REASON FOR ASSESSMENT:   Consult Assessment of nutrition requirement/status  ASSESSMENT:   82 year old male with history of coronary artery disease, A. fib on Anticoagulation, rheumatoid arthritis,arthritis, chronic kidney disease stage III (creatinine of 2.4 months back in [place], worsened to 3.4 one month back) chronic anemia, lymphoma, recent fall with T12-L1 compression fracture, hypertension, who recently moved to the area from [place] presented with SIRS with temperature 102F and acute encephalopathy in the setting of lobar pneumonia and UTI.  Pt reports having a decreased appetite which has been ongoing over the past 1 week. He reports mostly consuming fruit juices. He does say he had been consuming Ensure on occasion. Current meal completion has been 25-35%. Pt reports usual body weight of ~150 lbs. Pt is agreeable to nutritional supplements to aid in caloric and protein needs. RD to order Nepro shake. Pt encouraged to eat his food at meals.  Nutrition-Focused physical exam completed. Findings are severe fat depletion, severe muscle depletion, and no edema.   Labs and medications reviewed.   Diet Order:  Diet renal with fluid restriction Fluid restriction:: 1200 mL Fluid; Room service appropriate?: Yes; Fluid consistency:: Thin  Skin:  Wound (see comment) (Stage II on sacrum)  Last BM:  5/28  Height:   Ht Readings from Last 1  Encounters:  07/03/2015 6' (1.829 m)    Weight:   Wt Readings from Last 1 Encounters:  07/12/2015 157 lb (71.215 kg)    Ideal Body Weight:  80.9 kg  BMI:  Body mass index is 21.29 kg/(m^2).  Estimated Nutritional Needs:   Kcal:  [id number]  Protein:  75-90 grams  Fluid:  1.2 L/day  EDUCATION NEEDS:   No education needs identified at this time  Corrin Parker, MS, RD, LDN Pager # [phone] After hours/ weekend pager # [phone]

## 2015-06-27 NOTE — Clinical Social Work Note (Signed)
Clinical Social Work Assessment  Patient Details  Name: George Noble MRN: [id number] Date of Birth: 08/30/1924  Date of referral:  06/27/15               Reason for consult:  Facility Placement                Permission sought to share information with:  Family Supports Permission granted to share information::  No (Patient oriented to self only. Daughter was at the bedside)  Name::     Lianne Bushy  Agency::     Relationship::  Daughter  Contact Information:  [phone] - mobile  Housing/Transportation Living arrangements for the past 2 months:  Single Family Home Source of Information:  Adult Children (Daughter [name]) Patient Interpreter Needed:  None Criminal Activity/Legal Involvement Pertinent to Current Situation/Hospitalization:  No - Comment as needed Significant Relationships:  Adult Children, Other Family Members Lives with:  Adult Children (Patient lives with daughter [name]) Do you feel safe going back to the place where you live?  Yes (Daughter feels that patient is safe in her home, but is agreeable to ST rehab at a skilled facility before coming home.) Need for family participation in patient care:  Yes (Comment) (Patient only oriented to self)  Care giving concerns:  CSW talked with patient's daughter at the bedside. Ms. Francee Gentile reported that patient lives with her and has been able to be alone while she is works (PT). Daughter understands that patient is weaker and will need rehab to regain strength and be safer at home.   Social Worker assessment / plan:  CSW talked with daughter/patient regarding discharge planning. Patient spoke with CSW initially, but was lying in bed and allowed daughter to talk regarding discharge planning. Ms. Francee Gentile is hopeful that patient can return home and discharge and asked questions about [facility] services and who she would need to talk to. CSW and daughter also talked about the process for SNF placement if this is what rehab recommends, as  patient had not yet ben seen by physical therapy. CSW advised soon after visit with patient/daughter that SNF recommended and CSW talked again with daughter who was in agreement and SNF list provided.  Employment status:  Retired Forensic scientist:  Information systems manager, Public librarian Other) PT Recommendations:  [place] / Referral to community resources:  Other (Comment Required) (Daughter provided with skilled facility list for [facility])  Patient/Family's Response to care:  No concerns expressed by daughter of care during hospitalization. Ms. Francee Gentile did want to speak with the doctor as she had only talked with admitting MD.  Patient/Family's Understanding of and Emotional Response to Diagnosis, Current Treatment, and Prognosis:  Not discussed.  Emotional Assessment Appearance:  Appears stated age Attitude/Demeanor/Rapport:  Other (Appropriate) Affect (typically observed):  Appropriate, Calm Orientation:  Oriented to Self Alcohol / Substance use:  Tobacco Use (Patient reported that he quit smoking and drinks alcohol. Reported also reported no drug use.) Psych involvement (Current and /or in the community):  No (Comment)  Discharge Needs  Concerns to be addressed:  Discharge Planning Concerns Readmission within the last 30 days:  No Current discharge risk:  None Barriers to Discharge:  No Barriers Identified   Sable Feil, LCSW 06/27/2015, 4:48 PM

## 2015-06-28 ENCOUNTER — Inpatient Hospital Stay (HOSPITAL_COMMUNITY): Payer: Medicare Other

## 2015-06-28 LAB — RENAL FUNCTION PANEL
ALBUMIN: 2.2 g/dL — AB (ref 3.5–5.0)
ANION GAP: 10 (ref 5–15)
ANION GAP: 13 (ref 5–15)
Albumin: 2.2 g/dL — ABNORMAL LOW (ref 3.5–5.0)
BUN: 58 mg/dL — AB (ref 6–20)
BUN: 66 mg/dL — ABNORMAL HIGH (ref 6–20)
CALCIUM: 8 mg/dL — AB (ref 8.9–10.3)
CHLORIDE: 109 mmol/L (ref 101–111)
CO2: 15 mmol/L — ABNORMAL LOW (ref 22–32)
CO2: 15 mmol/L — AB (ref 22–32)
Calcium: 8.2 mg/dL — ABNORMAL LOW (ref 8.9–10.3)
Chloride: 113 mmol/L — ABNORMAL HIGH (ref 101–111)
Creatinine, Ser: 4.03 mg/dL — ABNORMAL HIGH (ref 0.61–1.24)
Creatinine, Ser: 4.47 mg/dL — ABNORMAL HIGH (ref 0.61–1.24)
GFR calc Af Amer: 12 mL/min — ABNORMAL LOW (ref >60)
GFR calc non Af Amer: 11 mL/min — ABNORMAL LOW (ref >60)
GFR, EST AFRICAN AMERICAN: 14 mL/min — AB (ref >60)
GFR, EST NON AFRICAN AMERICAN: 12 mL/min — AB (ref >60)
GLUCOSE: 115 mg/dL — AB (ref 65–99)
Glucose, Bld: 114 mg/dL — ABNORMAL HIGH (ref 65–99)
PHOSPHORUS: 4.4 mg/dL (ref 2.5–4.6)
POTASSIUM: 5.3 mmol/L — AB (ref 3.5–5.1)
POTASSIUM: 5.2 mmol/L — AB (ref 3.5–5.1)
Phosphorus: 5.2 mg/dL — ABNORMAL HIGH (ref 2.5–4.6)
SODIUM: 137 mmol/L (ref 135–145)
Sodium: 138 mmol/L (ref 135–145)

## 2015-06-28 LAB — CULTURE, RESPIRATORY W GRAM STAIN: Culture: NORMAL

## 2015-06-28 LAB — PROTIME-INR
INR: 2.11 — ABNORMAL HIGH (ref 0.00–1.49)
Prothrombin Time: 23.5 seconds — ABNORMAL HIGH (ref 11.6–15.2)

## 2015-06-28 LAB — CBC
HCT: 24.9 % — ABNORMAL LOW (ref 39.0–52.0)
HEMOGLOBIN: 8 g/dL — AB (ref 13.0–17.0)
MCH: 29.5 pg (ref 26.0–34.0)
MCHC: 32.1 g/dL (ref 30.0–36.0)
MCV: 91.9 fL (ref 78.0–100.0)
Platelets: 207 10*3/uL (ref 150–400)
RBC: 2.71 MIL/uL — ABNORMAL LOW (ref 4.22–5.81)
RDW: 15.1 % (ref 11.5–15.5)
WBC: 9.7 10*3/uL (ref 4.0–10.5)

## 2015-06-28 LAB — PROCALCITONIN: Procalcitonin: 2.98 ng/mL

## 2015-06-28 LAB — MAGNESIUM: MAGNESIUM: 1.7 mg/dL (ref 1.7–2.4)

## 2015-06-28 MED ORDER — IPRATROPIUM-ALBUTEROL 0.5-2.5 (3) MG/3ML IN SOLN
3.0000 mL | Freq: Four times a day (QID) | RESPIRATORY_TRACT | Status: DC
  Administered 2015-06-28 – 2015-06-30 (×8): 3 mL via RESPIRATORY_TRACT
  Filled 2015-06-28 (×8): qty 3

## 2015-06-28 MED ORDER — WARFARIN - PHARMACIST DOSING INPATIENT: Freq: Every day | Status: DC

## 2015-06-28 MED ORDER — FUROSEMIDE 10 MG/ML IJ SOLN
INTRAMUSCULAR | Status: AC
  Filled 2015-06-28: qty 4

## 2015-06-28 MED ORDER — PIPERACILLIN-TAZOBACTAM IN DEX 2-0.25 GM/50ML IV SOLN
2.2500 g | Freq: Four times a day (QID) | INTRAVENOUS | Status: DC
  Administered 2015-06-28 – 2015-06-30 (×8): 2.25 g via INTRAVENOUS
  Filled 2015-06-28 (×15): qty 50

## 2015-06-28 MED ORDER — FUROSEMIDE 10 MG/ML IJ SOLN
80.0000 mg | Freq: Three times a day (TID) | INTRAMUSCULAR | Status: DC
  Administered 2015-06-28 – 2015-06-29 (×3): 80 mg via INTRAVENOUS
  Filled 2015-06-28 (×3): qty 8

## 2015-06-28 MED ORDER — FUROSEMIDE 10 MG/ML IJ SOLN
40.0000 mg | Freq: Once | INTRAMUSCULAR | Status: AC
  Administered 2015-06-28: 40 mg via INTRAVENOUS

## 2015-06-28 MED ORDER — WARFARIN SODIUM 1 MG PO TABS
1.0000 mg | ORAL_TABLET | Freq: Once | ORAL | Status: AC
  Administered 2015-06-28: 1 mg via ORAL
  Filled 2015-06-28: qty 1

## 2015-06-28 MED ORDER — ALPRAZOLAM 0.25 MG PO TABS
0.2500 mg | ORAL_TABLET | Freq: Once | ORAL | Status: AC
  Administered 2015-06-28: 0.25 mg via ORAL
  Filled 2015-06-28: qty 1

## 2015-06-28 MED ORDER — VANCOMYCIN HCL IN DEXTROSE 1-5 GM/200ML-% IV SOLN
1000.0000 mg | INTRAVENOUS | Status: DC
  Administered 2015-06-28: 1000 mg via INTRAVENOUS
  Filled 2015-06-28 (×2): qty 200

## 2015-06-28 MED ORDER — ALBUTEROL SULFATE (2.5 MG/3ML) 0.083% IN NEBU: 2.5000 mg | INHALATION_SOLUTION | RESPIRATORY_TRACT | Status: DC | PRN

## 2015-06-28 NOTE — Progress Notes (Signed)
PROGRESS NOTE  George Noble  [id number] DOB: 25-May-1924 DOA: 07/11/2015 PCP: [name], MD  Brief Narrative:   82 year old male, recently moved from [place] in April 2017, staying with his daughter, with history of coronary artery disease, A. fib on Anticoagulation, rheumatoid arthritis,arthritis, chronic kidney disease stage III (creatinine of 2.4 months back in [place], worsened to 3.4 one month back) chronic anemia, lymphoma, recent fall with T12-L1 compression fracture/rib fractures, hypertension, presented with SIRS with temperature 102F and acute encephalopathy in the setting of lobar pneumonia and UTI. On 6/8, worsening respiratory started secondary to pulmonary edema and pneumonia. Discussed extensively with patient's daughter [name], clarified goals of care and transferred to stepdown unit on BiPAP (no CPR or intubation). Hope is for clinical improvement, DC to SNF for rehabilitation and be able to attend granddaughter's wedding in 4 weeks.  Assessment & Plan:   Active Problems:   Atrial fibrillation, permanent (HCC)   Seropositive rheumatoid arthritis of multiple sites (HCC)   Chronic kidney disease   HTN (hypertension)   Anemia, iron deficiency   UTI (lower urinary tract infection)   CAP (community acquired pneumonia)   Acute encephalopathy   CAD (coronary artery disease), native coronary artery   Acute on chronic renal failure (HCC)   Pressure ulcer   Protein-calorie malnutrition, severe   SIRS with acute encephalopathy secondary to lobar pneumonia -  Leukocytosis and fever resolving -  Started initially on Rocephin and azithromycin. Worsening respiratory status on 6/8 and worsening chest x-ray findings. Change antibiotics to IV vancomycin and Zosyn. We will get speech therapy to evaluate. Trend pro-calcitonin. Also provided a dose of Lasix for possible pulmonary edema. -  BCx NGTD  Acute respiratory failure with hypoxia - May be a combination of  pneumonia and pulmonary edema. Treatment as above. Discussed extensively with patient and daughter. Transfer to stepdown unit for BiPAP when necessary. No CPR or intubation. - Explained to daughter that patient is critically ill and outcome cannot be definitely outlining just yet. Reassess in a.m.  Active Problems: Acute on chronic kidney disease stage III- IV/mild hyperkalemia/metabolic acidosis, creatinine trended down slightly with IVF, but now edematous and wheezy Creatinine of 2.4 from 11/2014 in [place] and more recent creatinine has ranged 3.5-3.7.  Metabolic acidosis Avoid nephrotoxins and placed on gentle hydration Renal ultrasound: polycystic kidneys with evidence of medical renal disease Urology follow up for BPH Start sodium bicarbonate As per daughter, patient had seen nephrology/[name] recently as outpatient and determined not a candidate for hemodialysis. Nephrology consulted.  Atrial fibrillation, permanent ([place]), CHADS2VASC 4, anticoagulation indicated Rate controlled, Continue amiodarone. Discussed anticoagulation at length with patient's daughter. Since patient will likely be going to SNF post discharge, she wished to continue Coumadin at this time. INR therapeutic today.  Essential hypertension, blood pressures mildly elevated - continue beta blocker, norvasc  Coronary artery disease Continue beta blocker, statin, imdur  Iron deficiency anemia and anemia of chronic renal disease, folate and B12 wnl.  Iron level low, ferritin elevated from infection and RA -  TSH -  Occult stool -  Repeat hgb in AM  -  Hemoglobin stable. Supposed to see [name] as outpatient.  Sacral pressure ulcer Appreciate wound care evaluation.  BPH with evidence of urinary retention on RUS.  Still voiding  Continue Flomax D/c oxybutynin Urology follow up as outpatient.  Constipation -  Bisacodyl suppository -  Start miralax BID -  Senna -  Soap suds enema if bisacodyl  suppository doesn't work. No BM yet. Will not force enema today given tenous respiratory status.  Recent fall with T12-L1 compression fracture PT/OT evaluation > SNF SW consult Lidocaine patch to area  Seropositive RA -  Continue prednisone -  Continue ultram and oxycodone. Supposed to follow-up with rheumatology as outpatient.  Severe protein calorie malnutrition -  Regular diet with supplements, appreciate nutrition assistance  DVT prophylaxis:  heparin Code Status:  Partial code Family Communication:  Discussed in detail with patient and daughter Ms. Penny Disposition Plan:  DC to SNF when medically stable. Transferred to stepdown on 6/8.  Consultants:   None  Procedures:  none  Antimicrobials:   Ceftriaxone 6/5 > 6/8  Azithromcyin 6/5 > 6/8  Levofloxacin 6/5 x 1  IV Zosyn and vancomycin 6/8 >   Subjective: Confused. As per RN, worsening dyspnea overnight and this morning requiring increased oxygen. No pain reported.  Objective: Filed Vitals:   06/28/15 0757 06/28/15 1107 06/28/15 1145 06/28/15 1337  BP: 157/75  136/71   Pulse:  77 77   Temp: 98.3 F (36.8 C)     TempSrc: Oral     Resp:  22 24   Height:      Weight:      SpO2: 88% 100% 93% 100%    Intake/Output Summary (Last 24 hours) at 06/28/15 1553 Last data filed at 06/28/15 1151  Gross per 24 hour  Intake    810 ml  Output    875 ml  Net    -65 ml   Filed Weights   07/04/2015 1209 06/28/2015 1901 06/27/15 2002  Weight: 66.225 kg (146 lb) 71.215 kg (157 lb) 71.215 kg (157 lb)    Examination:  General exam: Pleasant elderly male, frail and chronically ill-looking, lying propped up in bed with mild respiratory distress. HEENT:  NCAT, MMM Respiratory system:  Reduced breath sounds bilaterally with scattered bilateral crackles but not much wheezing or rhonchi. Mild increased work of breathing. Cardiovascular system: S1 and S2 heard, irregularly irregular. No JVD, murmurs or pedal edema. Telemetry:  A. fib with controlled ventricular rate/BBB morphology. Gastrointestinal system: Normal active bowel sounds, soft, moderately distended, mildly TTP without rebound or guarding. MSK:  Normal tone and bulk, no leg edema. Neuro:  Grossly moves all extremities. Alert and oriented to self and partly to place. Psych:  Confused.    Data Reviewed: I have personally reviewed following labs and imaging studies  CBC:  Recent Labs Lab 07/05/2015 1330 06/26/15 0753 06/27/15 0655 06/28/15 0330  WBC 12.5* 8.0 8.6 9.7  NEUTROABS 11.3* 6.8  --   --   HGB 9.2* 7.9* 7.7* 8.0*  HCT 28.4* 24.7* 24.1* 24.9*  MCV 93.7 92.2 90.6 91.9  PLT 224 167 166 [id number]   Basic Metabolic Panel:  Recent Labs Lab 07/06/2015 1330 07/09/2015 2256 06/26/15 0753 06/27/15 0655 06/28/15 0330  NA 135 136 139 139 138  K 5.3* 5.1 5.2* 4.9 5.3*  CL 109 113* 114* 112* 113*  CO2 15* 17* 17* 15* 15*  GLUCOSE 110* 97 89 95 114*  BUN 66* 66* 65* 59* 58*  CREATININE 4.23* 4.19* 4.18* 4.03* 4.03*  CALCIUM 8.0* 7.6* 7.8* 8.0* 8.2*  MG  --   --   --   --  1.7  PHOS  --   --   --   --  4.4   GFR: Estimated Creatinine Clearance: 12.8 mL/min (by C-G formula based on Cr of 4.03). Liver Function Tests:  Recent Labs Lab 07/17/2015  1330 06/26/15 0753 06/28/15 0330  AST 22 22  --   ALT 17 18  --   ALKPHOS 45 41  --   BILITOT 1.0 0.9  --   PROT 5.8* 4.7*  --   ALBUMIN 2.8* 2.1* 2.2*   No results for input(s): LIPASE, AMYLASE in the last 168 hours. No results for input(s): AMMONIA in the last 168 hours. Coagulation Profile:  Recent Labs Lab 06/21/2015 1330 06/27/15 0655 06/28/15 0902  INR 1.56* 1.85* 2.11*   Cardiac Enzymes: No results for input(s): CKTOTAL, CKMB, CKMBINDEX, TROPONINI in the last 168 hours. BNP (last 3 results) No results for input(s): PROBNP in the last 8760 hours. HbA1C: No results for input(s): HGBA1C in the last 72 hours. CBG: No results for input(s): GLUCAP in the last 168 hours. Lipid  Profile: No results for input(s): CHOL, HDL, LDLCALC, TRIG, CHOLHDL, LDLDIRECT in the last 72 hours. Thyroid Function Tests: No results for input(s): TSH, T4TOTAL, FREET4, T3FREE, THYROIDAB in the last 72 hours. Anemia Panel:  Recent Labs  06/26/15 0753  VITAMINB12 437  FOLATE 25.8  FERRITIN 509*  TIBC 143*  IRON 14*  RETICCTPCT 1.1   Urine analysis:    Component Value Date/Time   COLORURINE YELLOW 07/08/2015 1255   APPEARANCEUR TURBID* 07/18/2015 1255   LABSPEC 1.014 06/29/2015 1255   PHURINE 8.0 07/02/2015 1255   GLUCOSEU NEGATIVE 07/10/2015 1255   HGBUR TRACE* 06/21/2015 1255   [place] 07/04/2015 1255   [place] 06/24/2015 1255   PROTEINUR >300* 07/08/2015 1255   NITRITE NEGATIVE 07/01/2015 1255   LEUKOCYTESUR SMALL* 06/24/2015 1255   Sepsis Labs: @LABRCNTIP (procalcitonin:4,lacticidven:4)  ) Recent Results (from the past 240 hour(s))  Urine culture     Status: Abnormal   Collection Time: 06/29/2015 12:55 PM  Result Value Ref Range Status   Specimen Description URINE, CLEAN CATCH  Final   Special Requests NONE  Final   Culture MULTIPLE SPECIES PRESENT, SUGGEST RECOLLECTION (A)  Final   Report Status 06/27/2015 FINAL  Final  Culture, sputum-assessment     Status: None   Collection Time: 06/26/15  1:54 AM  Result Value Ref Range Status   Specimen Description EXPECTORATED SPUTUM  Final   Special Requests Immunocompromised  Final   Sputum evaluation   Final    THIS SPECIMEN IS ACCEPTABLE. RESPIRATORY CULTURE REPORT TO FOLLOW.   Report Status 06/26/2015 FINAL  Final  Culture, respiratory (NON-Expectorated)     Status: None   Collection Time: 06/26/15  1:54 AM  Result Value Ref Range Status   Specimen Description SPUTUM  Final   Special Requests NONE  Final   Gram Stain   Final    MODERATE WBC PRESENT,BOTH PMN AND MONONUCLEAR FEW GRAM POSITIVE COCCI IN PAIRS    Culture Consistent with normal respiratory flora.  Final   Report Status  06/28/2015 FINAL  Final      Radiology Studies: US Renal  06/26/2015  CLINICAL DATA:  Acute renal failure EXAM: RENAL / URINARY TRACT ULTRASOUND COMPLETE COMPARISON:  None. FINDINGS: Right Kidney: Length: 12.0 cm. Multiple cysts are noted within the right kidney. There is a 2.7 cm cyst in the mid upper pole and a 18 2 point 5 cm cyst in the lower pole. Slight increased echogenicity is noted. No obstructive changes are noted. Left Kidney: Length: 14.8 cm. Multiple cysts are seen. The largest of these lies in the lower pole measuring 7 cm. A smaller 2.6 cm cyst is noted in the upper pole. Increased echogenicity  is noted. No obstructive changes are noted. Bladder: Well distended. A a focal soft tissue lesion is identified in the inferior aspect of the bladder likely representing the prostate gland. The possibility of an underlying bladder mass could not be totally excluded however. IMPRESSION: Bilateral renal cystic change. Increased echogenicity consistent with medical renal disease. Likely prostate indenting on the inferior aspect of the bladder although the possibility of a bladder mass could not be totally excluded. Direct visualization is recommended. Electronically Signed   By: Inez Catalina M.D.   On: 06/26/2015 18:17   Dg Chest Port 1 View  06/28/2015  CLINICAL DATA:  Shortness of breath EXAM: PORTABLE CHEST 1 VIEW COMPARISON:  06/27/2015; left rib radiographic series- 06/07/2015 FINDINGS: Grossly unchanged enlarged cardiac silhouette and mediastinal contours given AP projection and decreased lung volumes. Pulmonary vasculature is indistinct with cephalization of flow and development / progression of extensive perihilar heterogeneous airspace opacities. Trace bilateral effusions are suspected though suboptimally evaluated secondary exclusion of the bilateral hemidiaphragms. No pneumothorax. Old left-sided lateral rib fractures. No definite acute osseous abnormalities. IMPRESSION: Suspected worsening /  development of alveolar pulmonary edema though note, underlying infection and/or aspiration is not excluded. Electronically Signed   By: Sandi Mariscal M.D.   On: 06/28/2015 09:05     Scheduled Meds: . amiodarone  200 mg Oral Daily  . amLODipine  5 mg Oral Daily  . antiseptic oral rinse  7 mL Mouth Rinse BID  . aspirin EC  81 mg Oral Daily  . atorvastatin  20 mg Oral Daily  . bisacodyl  10 mg Rectal Daily  . calcitRIOL  0.25 mcg Oral Daily  . feeding supplement (NEPRO CARB STEADY)  237 mL Oral BID BM  . furosemide  80 mg Intravenous Q8H  . guaiFENesin  600 mg Oral BID  . ipratropium-albuterol  3 mL Nebulization Q6H  . isosorbide mononitrate  30 mg Oral Daily  . lidocaine  2 patch Transdermal Q24H  . metoprolol succinate  25 mg Oral Daily  . pantoprazole  40 mg Oral Daily  . piperacillin-tazobactam (ZOSYN)  IV  2.25 g Intravenous Q6H  . polyethylene glycol  17 g Oral BID  . predniSONE  10 mg Oral Q breakfast  . senna  2 tablet Oral QHS  . sodium bicarbonate  1,300 mg Oral BID  . tamsulosin  0.4 mg Oral Daily  . vancomycin  1,000 mg Intravenous Q48H  . warfarin  1 mg Oral ONCE-1800  . Warfarin - Pharmacist Dosing Inpatient   Does not apply q1800   Continuous Infusions:    LOS: 3 days    Time spent: 85 min    Andrews Tener, MD [facility] Pager [phone]  If 7PM-7AM, please contact night-coverage [email or web address] Password [facility] 06/28/2015, 3:53 PM

## 2015-06-28 NOTE — Progress Notes (Signed)
ANTICOAGULATION CONSULT NOTE - Follow Up Consult  Pharmacy Consult for Warfarin Indication: atrial fibrillation  Allergies  Allergen Reactions  . Penicillins Other (See Comments)    Unsure of reaction  . Sulfa Antibiotics Other (See Comments)    Unsure of reaction    Patient Measurements: Height: 6' (182.9 cm) Weight: 157 lb (71.215 kg) IBW/kg (Calculated) : 77.6 Heparin Dosing Weight: 71 kg  Vital Signs: Temp: 98.3 F (36.8 C) (06/08 0757) Temp Source: Oral (06/08 0757) BP: 157/75 mmHg (06/08 0757) Pulse Rate: 85 (06/08 0445)  Labs:  Recent Labs  06/29/2015 1330  06/26/15 0753 06/27/15 0655 06/28/15 0330 06/28/15 0902  HGB 9.2*  --  7.9* 7.7* 8.0*  --   HCT 28.4*  --  24.7* 24.1* 24.9*  --   PLT 224  --  167 166 207  --   APTT 37  --   --   --   --   --   LABPROT 18.7*  --   --  21.3*  --  23.5*  INR 1.56*  --   --  1.85*  --  2.11*  CREATININE 4.23*  < > 4.18* 4.03* 4.03*  --   < > = values in this interval not displayed.  Estimated Creatinine Clearance: 12.8 mL/min (by C-G formula based on Cr of 4.03).   Assessment: 84 YOM who presented on 6/5 with fever and confusion. The patient was on warfarin PTA for history of Afib -  Admit INR 1.56 on PTA dose of 1.5 mg/day. On amiodarone PTA/ resumed inpatient.   Hgb decreased 9.2>>7.9>7.7>8.0, plts [phone]. The patient has known lymphoma with anemia requiring intermittent transfusions. The patient was on Rocephin + Azithromycin for CAP coverage but this has been discontinued today, changed to vancomycin and zosyn.  FTT Eating 25-50% of meals on 6/6, none zero% -20% charted on 6/7.   Dr. Algis Liming has discussed pt's care & goal with pt's daughter this morning and decided to resumed coumadin today but no IV heparin bridge at this time.  INR this AM = 2.11,  INR has increased likely effect of 2.5 mg coumadin dose given on 6/6. No coumadin given 6/7.  No bleeding reported.     Goal of Therapy:  INR 2-3   Plan:   Warfarin 1 mg x 1 dose at 1800 today Daily PT/INR Will continue to monitor for any signs/symptoms of bleeding and will follow up with PT/INR in the a.m.   Nicole Cella, RPh Clinical Pharmacist Pager: [phone]  06/28/2015 10:34 AM

## 2015-06-28 NOTE — Care Management Important Message (Signed)
Important Message  Patient Details  Name: George Noble MRN: [id number] Date of Birth: 27-Aug-1924   Medicare Important Message Given:  Yes    Loann Quill 06/28/2015, 10:21 AM

## 2015-06-28 NOTE — Significant Event (Signed)
Rapid Response Event Note  Overview:  Called to assist with patient with increasing O2 needs Time Called: 0940 Event Type: Respiratory  Initial Focused Assessment:  Awake and alert - skin hot and dry - follows commands - mild confusion.  Mild resp distress with increased rate 28 - able to speak full sentences-  bil BS with rhonchi and rales - had 2 episodes of desaturations - has little reserve for desats - abd soft - condom cath patent dark yellow urine.  Monitor shows SR rate 78  BP  160/78 O2 sats 85% on 4 liter nasal cannula. Rectal temp 100.7.  No nausea or vomiting reported per Allyson RN - kept NPO except for meds with sips in anticipation of need for Bipap.    Interventions:  Increased nasal cannula to 5 liters - no real change in O2 saturations - placed on NRB mask - #20 angio cath started left forearm  - 40 mg Lasix IV given by student RN - repositioned in bed - oral care given.  Dr. Algis Liming and Dr. Mercy Moore to bedside.  Daughter present - updated.  O2 sats 95-92 % with RR 26 - mild distress. Right and left antecubital IV's removed- cath intact.  Order for BiPap - RT here - placed on Bipap 40% and 10/5 - tol well.  O2 sats to 98%.  Zoysn and Vancomycin ordered - Vanc to bedside - started.  Transferred to [phone] with [place] well.  Handoff to [employer].  Patient stable.  Daughter at bedside - updated patient and daughter questions answered.     Event Summary: Name of Physician Notified: Dr. Algis Liming  at  (pta RRT)  Name of Consulting Physician Notified: Dr. Mercy Moore at    Outcome: Transferred (Comment) (3W SDU)  Event End Time: [place], [place]

## 2015-06-28 NOTE — Telephone Encounter (Signed)
More than 60 pages of hematology records reviewed.   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 He had a bone marrow biopsy 04/11/2014: It showed low-grade B-cell lymphoma about 5% of marrow cellularity. Adequate iron stores and normal karyotype analysis. ALL RECORDS WILL BE SENT TO BE SCAN 

## 2015-06-28 NOTE — Telephone Encounter (Signed)
Surgical records  reviewed, office visit from 04/18/2015, the patient was offered a AV fistula. ---- GI records reviewed,  DOS 04-14-13: Patient previously refused a colonoscopy. DX was change in bowel habits DOS: 05/11/2013, seen with constipation, CT was done and showed a distal left ureter slightly dilated, diverticuli,   and mild aneurysmal dilatation of the aorta. DOS 06/17/2013: Symptoms were predominantly constipation and occasionally overflow diarrhea controlled with Benefiber and Amitiza

## 2015-06-28 NOTE — Clinical Social Work Placement (Signed)
   CLINICAL SOCIAL WORK PLACEMENT  NOTE  Date:  06/28/2015  Patient Details  Name: Christepher Fronheiser MRN: [id number] Date of Birth: 1924-03-13  Clinical Social Work is seeking post-discharge placement for this patient at the [place] level of care (*CSW will initial, date and re-position this form in  chart as items are completed):  Yes   Patient/family provided with [facility] Work Department's list of facilities offering this level of care within the geographic area requested by the patient (or if unable, by the patient's family).  Yes   Patient/family informed of their freedom to choose among providers that offer the needed level of care, that participate in Medicare, Medicaid or managed care program needed by the patient, have an available bed and are willing to accept the patient.  Yes   Patient/family informed of [facility]'s ownership interest in [facility] and [facility], as well as of the fact that they are under no obligation to receive care at these facilities.  PASRR submitted to EDS on 06/28/15     PASRR number received on 06/27/15     Existing PASRR number confirmed on       FL2 transmitted to all facilities in geographic area requested by pt/family on 06/28/15     FL2 transmitted to all facilities within larger geographic area on       Patient informed that his/her managed care company has contracts with or will negotiate with certain facilities, including the following:            Patient/family informed of bed offers received.  Patient chooses bed at       Physician recommends and patient chooses bed at      Patient to be transferred to   on  .  Patient to be transferred to facility by       Patient family notified on   of transfer.  Name of family member notified:        PHYSICIAN Please prepare priority discharge summary, including medications, Please sign FL2, Please prepare prescriptions     Additional Comment:     _______________________________________________ Sable Feil, LCSW 06/28/2015, 10:56 AM

## 2015-06-28 NOTE — Progress Notes (Signed)
06/28/2015 11:51 AM  This is a late entry note.    Upon assessment of patient this am, patient noted to be tachypenic, with increased work of  Breathing.  Lung auscultation revealed expiratory wheezes in the upper lobes as well as fine crackles throughout.  Oxygen saturations at this time on 5L at 87%.  Dr. Algis Liming notified, orders received.  RT also notified to administer prn breathing treatment.  A portable chest xray was done, and results were called to Dr. Algis Liming.  At this time, rapid response was notified for an extra hand in caring for the patient.  Pt was given a dose of lasix (40mg ).  Pt was placed on a NRB at the time the rapid response RN assessed the patient, as his breathing was not improving on 5-6L via nasal cannula.  Pt tolerated the NRB for quite a while, until the decision was made by both Dr. Algis Liming and the rapid response RN that the patient would benefit best from BiPAP. Pt also received another 40mg  of IV lasix.   RT notified, and patient was placed on BiPAP.  Pt tolerated this and his saturations remained stable at this point.  Orders received to transfer patient to 3W.  Report was called to receiving RN, and daughter at the bedside was updated on the plan of care.  Pt was transferred to 3W by the rapid response RN, with assistance from RT and the NT.  Belongings sent with patient.  Princella Pellegrini

## 2015-06-28 NOTE — Progress Notes (Signed)
Patient resting comfortably on NRB mask at this time.  RR 17 and non labored.  SpO2 100%.  No clinical indication for Bipap.  RT will continue to monitor.

## 2015-06-28 NOTE — Consult Note (Signed)
Consultation Note Date: 06/28/2015   Patient Name: George Noble  DOB: March 07, 1924  MRN: [id number]  Age / Sex: 82 y.o., male  PCP: George Branch, MD Referring Physician: Modena Jansky, MD  Reason for Consultation: Goals of care  HPI/Patient Profile: 82 y.o. male  with past medical history of coronary disease, atrial fibrillation on chronic anticoagulation, rheumatoid arthritis, HTN, chronic kidney disease chronic anemia, lymphoma recent falls with T12-L1 compression fractures admitted on 06/21/2015 with sepsis pneumonia and UTI. Also struggling with acute on chronic kidney failure. He continues to decline with respiratory distress and rapid response calls and needed BiPAP last night.  Clinical Assessment and Goals of Care: I met today with George Noble and his daughter George Noble and her children. George Noble has responded to BiPAP and Lasix fairly well. He is talking and joking (actually cannot get him to stop talking to converse with daughter) but remains confused. He is in good spirits. Denies pain or discomfort currently. George Noble is tearful and explains that he came here from [place] recently to see her 2 children graduate University and was staying to see her other daughter get married in 4 weeks. George Noble acknowledges that she felt like after these events her father would be ready to die. QOL has been diminishing and he has likely been having more falls than we know of at home alone. Her plan was to get him to remain living with her here in [place] and was trying to establish him with providers here for his care. She is desperate to get him to his granddaughters wedding in 4 weeks - even to the point of considering intubation. I discouraged her to consider this as I believe if he declines to the point of requiring mechanical intubation I believe he would very unlikely to improve to attend the wedding. George Noble is tearful but I  also remain hopeful as he looks quite well today. We will continue aggressive treatment to give him the best opportunity for improvement. Although family hopeful I do not believe that they are unrealistic and they do understand he could decline again and not make it to the wedding. I do not believe that they would want to put him through interventions that we do not believe will help him. For now will be watchful waiting and I will reassess tomorrow. Emotional support provided to patient and family.   NEXT OF KIN daughter George Noble at bedside. 2 other children but they are out of state and not readily available.     SUMMARY OF RECOMMENDATIONS   - Watchful waiting   Code Status/Advance Care Planning:  Limited code - no CPR, no intubation (although daughter questioning this). Recommend DNR with otherwise full care.    Symptom Management:   Pain s/t recent rib fractures as well as chronic arthritic pain in back: Continue Lidoderm patch. Continue Tramadol prn. Trying not to oversedate but also high risk of delirium with pain or pain medication. Will reassess tomorrow.   Palliative Prophylaxis:   Bowel Regimen, Delirium Protocol,  Frequent Pain Assessment and Turn Reposition  Additional Recommendations (Limitations, Scope, Preferences):  Full Scope Treatment  Psycho-social/Spiritual:   Desire for further Chaplaincy support:no  Additional Recommendations: Caregiving  Support/Resources  Prognosis:   Unable to determine: Multiple comorbitities and declining functional status prognosis is very tenuous. High risk for acute decline.   Discharge Planning: To Be Determined      Primary Diagnoses: Present on Admission:  . UTI (lower urinary tract infection) . Anemia, iron deficiency . Atrial fibrillation, permanent ([place]) . CAP (community acquired pneumonia) . Chronic kidney disease . HTN (hypertension) . Acute encephalopathy . CAD (coronary artery disease), native coronary artery .  Acute on chronic renal failure ([place]) . Seropositive rheumatoid arthritis of multiple sites [facility])  I have reviewed the medical record, interviewed the patient and family, and examined the patient. The following aspects are pertinent.  Past Medical History  Diagnosis Date  . Seropositive rheumatoid arthritis of multiple sites ([place]) 11/2013  . Inflammatory polyarthropathy ([place])   . Low grade B-cell lymphoma ([place])   . CAD (coronary artery disease)   . Myocardial infarction [facility]) 1995    inferior, multivessel CABG  . H/O abdominal aortic aneurysm   . Atrial fibrillation, permanent ([place])   . Hyperlipidemia   . Valvular insufficiency   . Complete left bundle Noble block (LBBB)     on EKG  . GERD (gastroesophageal reflux disease)   . Anemia     low iron stores  . Hypertension   . History of chicken pox   . Chronic kidney disease    Social History   Social History  . Marital Status: Widowed    Spouse Name: N/A  . Number of Children: 3  . Years of Education: N/A   Occupational History  . retired    Social History Main Topics  . Smoking status: Former Research scientist (life sciences)  . Smokeless tobacco: None     Comment: quit remotely   . Alcohol Use: 0.0 oz/week    0 Standard drinks or equivalent per week     Comment: not daily   . Drug Use: No  . Sexual Activity: Not Asked   Other Topics Concern  . None   Social History Narrative   Moved from [place], [place] 04-2015 to live w/ George Noble, daughter   Son in law George Noble   Family History  Problem Relation Age of Onset  . Rheum arthritis Mother   . CAD Father   . Rheum arthritis Sister   . Breast cancer Sister   . Diabetes Neg Hx   . Stroke Neg Hx    Scheduled Meds: . amiodarone  200 mg Oral Daily  . amLODipine  5 mg Oral Daily  . antiseptic oral rinse  7 mL Mouth Rinse BID  . aspirin EC  81 mg Oral Daily  . atorvastatin  20 mg Oral Daily  . bisacodyl  10 mg Rectal Daily  . calcitRIOL  0.25 mcg Oral Daily  . feeding supplement (NEPRO  CARB STEADY)  237 mL Oral BID BM  . furosemide  80 mg Intravenous Q8H  . guaiFENesin  600 mg Oral BID  . ipratropium-albuterol  3 mL Nebulization Q6H  . isosorbide mononitrate  30 mg Oral Daily  . lidocaine  2 patch Transdermal Q24H  . metoprolol succinate  25 mg Oral Daily  . pantoprazole  40 mg Oral Daily  . piperacillin-tazobactam (ZOSYN)  IV  2.25 g Intravenous Q6H  . polyethylene glycol  17 g Oral BID  .  predniSONE  10 mg Oral Q breakfast  . senna  2 tablet Oral QHS  . sodium bicarbonate  1,300 mg Oral BID  . tamsulosin  0.4 mg Oral Daily  . vancomycin  1,000 mg Intravenous Q48H  . warfarin  1 mg Oral ONCE-1800  . Warfarin - Pharmacist Dosing Inpatient   Does not apply q1800   Continuous Infusions:  PRN Meds:.acetaminophen, albuterol, bisacodyl, traMADol Medications Prior to Admission:  Prior to Admission medications   Medication Sig Start Date End Date Taking? Authorizing Provider  amiodarone (PACERONE) 200 MG tablet Take 200 mg by mouth daily.   Yes Historical Provider, MD  amLODipine (NORVASC) 5 MG tablet Take 5 mg by mouth daily.   Yes Historical Provider, MD  atorvastatin (LIPITOR) 20 MG tablet Take 20 mg by mouth daily.   Yes Historical Provider, MD  calcitRIOL (ROCALTROL) 0.25 MCG capsule Take 1 capsule (0.25 mcg total) by mouth daily. 06/20/15  Yes George Branch, MD  ERGOCALCIFEROL PO Take 1.25 mg by mouth once a week.   Yes Historical Provider, MD  isosorbide mononitrate (IMDUR) 30 MG 24 hr tablet Take 30 mg by mouth daily.   Yes Historical Provider, MD  metoprolol succinate (TOPROL-XL) 25 MG 24 hr tablet Take 25 mg by mouth daily.   Yes Historical Provider, MD  Multiple Vitamins-Minerals (PRESERVISION AREDS 2) CAPS Take by mouth daily. Reported on 06/07/2015   Yes Historical Provider, MD  omeprazole (PRILOSEC) 20 MG capsule Take 1 capsule (20 mg total) by mouth daily. 06/20/15  Yes George Branch, MD  oxybutynin (DITROPAN-XL) 10 MG 24 hr tablet Take 10 mg by mouth at bedtime.    Yes Historical Provider, MD  oxyCODONE-acetaminophen (PERCOCET) 7.5-325 MG tablet Take 1 tablet by mouth every 8 (eight) hours as needed for severe pain. 06/21/15  Yes George Branch, MD  predniSONE (DELTASONE) 10 MG tablet Take 10 mg by mouth daily with breakfast.   Yes Historical Provider, MD  tamsulosin (FLOMAX) 0.4 MG CAPS capsule Take 0.4 mg by mouth daily.   Yes Historical Provider, MD  Tofacitinib Citrate (XELJANZ) 5 MG TABS Take 1 tablet by mouth daily.   Yes Historical Provider, MD  traMADol (ULTRAM) 50 MG tablet Take 0.5-1 tablets (25-50 mg total) by mouth every 12 (twelve) hours as needed. Patient taking differently: Take 25-50 mg by mouth every 12 (twelve) hours as needed for moderate pain.  06/08/15  Yes George Branch, MD  warfarin (COUMADIN) 3 MG tablet Take 1.5 mg by mouth daily.    Yes Historical Provider, MD   Allergies  Allergen Reactions  . Penicillins Other (See Comments)    Unsure of reaction  . Sulfa Antibiotics Other (See Comments)    Unsure of reaction   Review of Systems  Unable to perform ROS: Mental status change    Physical Exam  Constitutional: He appears well-developed.  HENT:  Head: Normocephalic and atraumatic.  Cardiovascular: Normal rate.   Pulmonary/Chest: No accessory muscle usage. No tachypnea. He has decreased breath sounds.  Mild distress  Abdominal: Soft. Normal appearance.  Neurological: He is alert. He is disoriented.    Vital Signs: BP 136/71 mmHg  Pulse 77  Temp(Src) 98.3 F (36.8 C) (Oral)  Resp 24  Ht 6' (1.829 m)  Wt 71.215 kg (157 lb)  BMI 21.29 kg/m2  SpO2 100% Pain Assessment: 0-10   Pain Score: 4    SpO2: SpO2: 100 % O2 Device:SpO2: 100 % O2 Flow Rate: .O2 Flow Rate (L/min): 5  L/min  IO: Intake/output summary:  Intake/Output Summary (Last 24 hours) at 06/28/15 1650 Last data filed at 06/28/15 1151  Gross per 24 hour  Intake    810 ml  Output    875 ml  Net    -65 ml    LBM: Last BM Date: 06/17/15 Baseline Weight:  Weight: 66.225 kg (146 lb) Most recent weight: Weight: 71.215 kg (157 lb)     Palliative Assessment/Data:     Time In: 1530 Time Out: 1700 Time Total: 29mn Greater than 50%  of this time was spent counseling and coordinating care related to the above assessment and plan.  Signed by: PPershing Proud NP   Please contact Palliative Medicine Team phone at [phone] questions and concerns.  For individual provider: See AShea Evans

## 2015-06-28 NOTE — Consult Note (Signed)
George Noble is an 82 y.o. 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  Has been seen in our office by Dr Lorrene Reid with Scr 3.8 on 06/22/15 and with recommendations that HD would not be a good idea due to his fraility.  Scr here was 4.23 at it highest and now 4.  UO has been good.  Today having increasing SOB with CXR showing some pulm edema.  Renal US 6/617 showed rt 12cm Lt 14.8cm with bil multiple cysts, no hydro.  He was followed by a nephrologist in [place] prior to moving here.  Past Medical History  Diagnosis Date  . Seropositive rheumatoid arthritis of multiple sites ([place]) 11/2013  . Inflammatory polyarthropathy ([place])   . Low grade B-cell lymphoma ([place])   . CAD (coronary artery disease)   . Myocardial infarction [facility]) 1995    inferior, multivessel CABG  . H/O abdominal aortic aneurysm   . Atrial fibrillation, permanent ([place])   . Hyperlipidemia   . Valvular insufficiency   . Complete left bundle branch block (LBBB)     on EKG  . GERD (gastroesophageal reflux disease)   . Anemia     low iron stores  . Hypertension   . History of chicken pox   . Chronic kidney disease     Past Surgical History  Procedure Laterality Date  . Coronary artery bypass graft  1995    multivessel  . Inguinal hernia repair Bilateral     Family History  Problem Relation Age of Onset  . Rheum arthritis Mother   . CAD Father   . Rheum arthritis Sister   . Breast cancer Sister   . Diabetes Neg Hx   . Stroke Neg Hx    Social History:  reports that he has quit smoking. He does not have any smokeless tobacco history on file. He reports that he drinks alcohol. He reports that he does not use illicit drugs. Moved here from [place] a few months ago to live with daughter and see grandkids graduate and 1 get married  Allergies:  Allergies  Allergen Reactions  . Penicillins Other (See Comments)     Unsure of reaction  . Sulfa Antibiotics Other (See Comments)    Unsure of reaction    Medications Prior to Admission  Medication Sig Dispense Refill  . amiodarone (PACERONE) 200 MG tablet Take 200 mg by mouth daily.    Marland Kitchen amLODipine (NORVASC) 5 MG tablet Take 5 mg by mouth daily.    Marland Kitchen atorvastatin (LIPITOR) 20 MG tablet Take 20 mg by mouth daily.    . calcitRIOL (ROCALTROL) 0.25 MCG capsule Take 1 capsule (0.25 mcg total) by mouth daily. 30 capsule 3  . ERGOCALCIFEROL PO Take 1.25 mg by mouth once a week.    . isosorbide mononitrate (IMDUR) 30 MG 24 hr tablet Take 30 mg by mouth daily.    . metoprolol succinate (TOPROL-XL) 25 MG 24 hr tablet Take 25 mg by mouth daily.    . Multiple Vitamins-Minerals (PRESERVISION AREDS 2) CAPS Take by mouth daily. Reported on 06/07/2015    . omeprazole (PRILOSEC) 20 MG capsule Take 1 capsule (20 mg total) by mouth daily. 30 capsule 3  . oxybutynin (DITROPAN-XL) 10 MG 24 hr tablet Take 10 mg by mouth at bedtime.    Marland Kitchen oxyCODONE-acetaminophen (PERCOCET) 7.5-325 MG tablet Take 1 tablet by mouth every  8 (eight) hours as needed for severe pain. 60 tablet 0  . predniSONE (DELTASONE) 10 MG tablet Take 10 mg by mouth daily with breakfast.    . tamsulosin (FLOMAX) 0.4 MG CAPS capsule Take 0.4 mg by mouth daily.    . Tofacitinib Citrate (XELJANZ) 5 MG TABS Take 1 tablet by mouth daily.    . traMADol (ULTRAM) 50 MG tablet Take 0.5-1 tablets (25-50 mg total) by mouth every 12 (twelve) hours as needed. (Patient taking differently: Take 25-50 mg by mouth every 12 (twelve) hours as needed for moderate pain. ) 30 tablet 0  . warfarin (COUMADIN) 3 MG tablet Take 1.5 mg by mouth daily.        Lab Results: UA: 6-30 wbc, 0-5 rbc, >300 protein from 07/14/2015   Recent Labs  06/26/15 0753 06/27/15 0655 06/28/15 0330  WBC 8.0 8.6 9.7  HGB 7.9* 7.7* 8.0*  HCT 24.7* 24.1* 24.9*  PLT 167 166 207   BMET  Recent Labs  06/26/15 0753 06/27/15 0655 06/28/15 0330  NA 139 139  138  K 5.2* 4.9 5.3*  CL 114* 112* 113*  CO2 17* 15* 15*  GLUCOSE 89 95 114*  BUN 65* 59* 58*  CREATININE 4.18* 4.03* 4.03*  CALCIUM 7.8* 8.0* 8.2*  PHOS  --   --  4.4   LFT  Recent Labs  06/26/15 0753 06/28/15 0330  PROT 4.7*  --   ALBUMIN 2.1* 2.2*  AST 22  --   ALT 18  --   ALKPHOS 41  --   BILITOT 0.9  --    US Renal  06/26/2015  CLINICAL DATA:  Acute renal failure EXAM: RENAL / URINARY TRACT ULTRASOUND COMPLETE COMPARISON:  None. FINDINGS: Right Kidney: Length: 12.0 cm. Multiple cysts are noted within the right kidney. There is a 2.7 cm cyst in the mid upper pole and a 18 2 point 5 cm cyst in the lower pole. Slight increased echogenicity is noted. No obstructive changes are noted. Left Kidney: Length: 14.8 cm. Multiple cysts are seen. The largest of these lies in the lower pole measuring 7 cm. A smaller 2.6 cm cyst is noted in the upper pole. Increased echogenicity is noted. No obstructive changes are noted. Bladder: Well distended. A a focal soft tissue lesion is identified in the inferior aspect of the bladder likely representing the prostate gland. The possibility of an underlying bladder mass could not be totally excluded however. IMPRESSION: Bilateral renal cystic change. Increased echogenicity consistent with medical renal disease. Likely prostate indenting on the inferior aspect of the bladder although the possibility of a bladder mass could not be totally excluded. Direct visualization is recommended. Electronically Signed   By: Inez Catalina M.D.   On: 06/26/2015 18:17   Dg Chest Port 1 View  06/28/2015  CLINICAL DATA:  Shortness of breath EXAM: PORTABLE CHEST 1 VIEW COMPARISON:  06/27/2015; left rib radiographic series- 06/07/2015 FINDINGS: Grossly unchanged enlarged cardiac silhouette and mediastinal contours given AP projection and decreased lung volumes. Pulmonary vasculature is indistinct with cephalization of flow and development / progression of extensive perihilar  heterogeneous airspace opacities. Trace bilateral effusions are suspected though suboptimally evaluated secondary exclusion of the bilateral hemidiaphragms. No pneumothorax. Old left-sided lateral rib fractures. No definite acute osseous abnormalities. IMPRESSION: Suspected worsening / development of alveolar pulmonary edema though note, underlying infection and/or aspiration is not excluded. Electronically Signed   By: Sandi Mariscal M.D.   On: 06/28/2015 09:05    ROS: Appetite fair +  SOB No CP No Abd pain No dysuria Chronic pain in hands No new neuropathic sxs  PHYSICAL EXAM: Blood pressure 157/75, pulse 85, temperature 98.3 F (36.8 C), temperature source Oral, resp. rate 24, height 6' (1.829 m), weight 71.215 kg (157 lb), SpO2 88 %. HEENT: PERRLA EOMI  Wearing face mask NECK:mild JVD LUNGS:Bil crackles CARDIAC:irreg,irreg 1/6 systolic M ABD:+ BS NTND No HSM EXT:No edema  Ulnar deviation of hands Lt>RT NEURO:CNI Ox3, no asterixis  Assessment: 1. CKD 4/5.  A discussion about HD has already occurred as an outpt and I again discussed it today with pt and daughter and we are all in agreement that HD will not be done.  We will treat what we can without use of HD 2. SOB with some pulm edema 3. Anemia sec CKD and Fe def.  he has received 1 dose of feraheme and 1 dose aranesp 4. Sec HPTH on calcitriol 5. Met acidosis on PO bicarb PLAN: 1. Will increase lasix to 56m BID and can increase further from there 2. Daily Scr 3. Note plans to transfer to stepdown and place on BiPap 4. Will follow with you 5. Cont calcitriol   Edilia Ghuman T 06/28/2015, 10:48 AM

## 2015-06-28 NOTE — Progress Notes (Signed)
Pharmacy Antibiotic Note  George Noble is a 82 y.o. male admitted on 06/21/2015 with with fever and confusion. On admission he was started on IV Rocephin and Azithromycin for pneumonia and ?UTI.  Rocephin & azithromycin discontinued today 06/28/15.  Pharmacy has been consulted for Vancomycin IV and Zosyn IV dosing for pneumonia. He has acute on CKD stage 3-4, SCr 4.03,  CrCL ~ 12 ml/min I/O 1392.5/575, UOP 0.54ml/kg/hr, urine occurrence documented 4x.    Plan: Zosyn 2.25 gm IV q6h Vancomycin 1000 mg IV q48h Monitor clinical status, renal function, culture results and check  vanc trough at steady state.   Height: 6' (182.9 cm) Weight: 157 lb (71.215 kg) IBW/kg (Calculated) : 77.6  Temp (24hrs), Avg:98.4 F (36.9 C), Min:98 F (36.7 C), Max:99 F (37.2 C)   Recent Labs Lab 07/18/2015 1330 06/24/2015 2256 06/26/15 0753 06/27/15 0655 06/28/15 0330  WBC 12.5*  --  8.0 8.6 9.7  CREATININE 4.23* 4.19* 4.18* 4.03* 4.03*    Estimated Creatinine Clearance: 12.8 mL/min (by C-G formula based on Cr of 4.03).    Allergies  Allergen Reactions  . Penicillins Other (See Comments)    Unsure of reaction  . Sulfa Antibiotics Other (See Comments)    Unsure of reaction    Antimicrobials this admission: Rocephin 6/6>(6/12) (for 7days, on 6/6 received dose @[facility]  & later at 23:45)  Azithro 6/6 > (6/13) Levaquin x 1 6/5 Vancomycin 6/8>> Zosyn 6/8>>  Dose adjustments this admission: None at this time  Microbiology results: 6/6 Sputum cx few GPC pairs 6/5 urine, neg/f   Thank you for allowing pharmacy to be a part of this patient's care.  Nicole Cella, RPh Clinical Pharmacist Pager: [phone] 06/28/2015 10:14 AM

## 2015-06-28 NOTE — Progress Notes (Signed)
Called to bedside d/t pt w/ BBSH crackles and tachypnea, Pt was on NRB d/t desat- per rapid response RN.  Pt placed on bipap, pt tol well so far, family and RN at bedside.  VSS.  BBSH w/ fine crackles.

## 2015-06-28 NOTE — Progress Notes (Signed)
OT Cancellation Note  Patient Details Name: George Noble MRN: [id number] DOB: [birth year]   Cancelled Treatment:    Reason Eval/Treat Not Completed: Other (comment). Nursing in working with pt, will re attempt later today  Britt Bottom 06/28/2015, 10:01 AM

## 2015-06-28 NOTE — Progress Notes (Signed)
OT Cancellation Note  Patient Details Name: George Noble MRN: [id number] DOB: October 22, 1924   Cancelled Treatment:    Reason Eval/Treat Not Completed: Medical issues which prohibited therapy, pt transferred to step down unit. Re attempt by OT when appropriate  Britt Bottom 06/28/2015, 1:02 PM

## 2015-06-29 ENCOUNTER — Encounter: Payer: Self-pay | Admitting: Internal Medicine

## 2015-06-29 ENCOUNTER — Inpatient Hospital Stay (HOSPITAL_COMMUNITY): Payer: Medicare Other

## 2015-06-29 ENCOUNTER — Ambulatory Visit: Payer: Medicare Other

## 2015-06-29 DIAGNOSIS — R0602 Shortness of breath: Secondary | ICD-10-CM | POA: Insufficient documentation

## 2015-06-29 DIAGNOSIS — Z66 Do not resuscitate: Secondary | ICD-10-CM | POA: Insufficient documentation

## 2015-06-29 DIAGNOSIS — I509 Heart failure, unspecified: Secondary | ICD-10-CM

## 2015-06-29 DIAGNOSIS — Z515 Encounter for palliative care: Secondary | ICD-10-CM | POA: Insufficient documentation

## 2015-06-29 DIAGNOSIS — J9601 Acute respiratory failure with hypoxia: Secondary | ICD-10-CM | POA: Insufficient documentation

## 2015-06-29 LAB — ECHOCARDIOGRAM COMPLETE
AOASC: 32 cm
CHL CUP REG VEL DIAS: 95 cm/s
CHL CUP STROKE VOLUME: 52 mL
EWDT: 153 ms
FS: 9 % — AB (ref 28–44)
HEIGHTINCHES: 72 in
IVS/LV PW RATIO, ED: 1.27
LA diam index: 2.01 cm/m2
LA vol A4C: 79.1 ml
LA vol: 78.4 mL
LASIZE: 38 mm
LAVOLIN: 41.5 mL/m2
LDCA: 2.84 cm2
LEFT ATRIUM END SYS DIAM: 38 mm
LV SIMPSON'S DISK: 30
LV dias vol: 174 mL — AB (ref 62–150)
LV sys vol index: 65 mL/m2
LVDIAVOLIN: 92 mL/m2
LVOT diameter: 19 mm
LVSYSVOL: 122 mL — AB (ref 21–61)
MV Dec: 153
MV pk A vel: 85.4 m/s
MV pk E vel: 72.3 m/s
MVPG: 2 mmHg
P 1/2 time: 375 ms
PW: 7.01 mm — AB (ref 0.6–1.1)
TAPSE: 13 mm
VTI: 159 cm
WEIGHTICAEL: 2420.8 [oz_av]

## 2015-06-29 LAB — RENAL FUNCTION PANEL
Albumin: 2.1 g/dL — ABNORMAL LOW (ref 3.5–5.0)
Anion gap: 14 (ref 5–15)
BUN: 78 mg/dL — AB (ref 6–20)
CHLORIDE: 110 mmol/L (ref 101–111)
CO2: 17 mmol/L — AB (ref 22–32)
CREATININE: 5.16 mg/dL — AB (ref 0.61–1.24)
Calcium: 8 mg/dL — ABNORMAL LOW (ref 8.9–10.3)
GFR calc Af Amer: 10 mL/min — ABNORMAL LOW (ref >60)
GFR, EST NON AFRICAN AMERICAN: 9 mL/min — AB (ref >60)
GLUCOSE: 109 mg/dL — AB (ref 65–99)
Phosphorus: 7.3 mg/dL — ABNORMAL HIGH (ref 2.5–4.6)
Potassium: 5.3 mmol/L — ABNORMAL HIGH (ref 3.5–5.1)
Sodium: 141 mmol/L (ref 135–145)

## 2015-06-29 LAB — CBC
HCT: 23.8 % — ABNORMAL LOW (ref 39.0–52.0)
HEMOGLOBIN: 7.5 g/dL — AB (ref 13.0–17.0)
MCH: 28.4 pg (ref 26.0–34.0)
MCHC: 31.5 g/dL (ref 30.0–36.0)
MCV: 90.2 fL (ref 78.0–100.0)
PLATELETS: 185 10*3/uL (ref 150–400)
RBC: 2.64 MIL/uL — AB (ref 4.22–5.81)
RDW: 14.9 % (ref 11.5–15.5)
WBC: 9.5 10*3/uL (ref 4.0–10.5)

## 2015-06-29 LAB — PROTIME-INR
INR: 2.43 — ABNORMAL HIGH (ref 0.00–1.49)
Prothrombin Time: 26.1 seconds — ABNORMAL HIGH (ref 11.6–15.2)

## 2015-06-29 MED ORDER — MORPHINE SULFATE (PF) 2 MG/ML IV SOLN
2.0000 mg | INTRAVENOUS | Status: DC | PRN
  Administered 2015-06-29 (×3): 2 mg via INTRAVENOUS
  Filled 2015-06-29 (×3): qty 1

## 2015-06-29 MED ORDER — FUROSEMIDE 10 MG/ML IJ SOLN
160.0000 mg | Freq: Three times a day (TID) | INTRAVENOUS | Status: DC
  Administered 2015-06-29 – 2015-06-30 (×5): 160 mg via INTRAVENOUS
  Filled 2015-06-29 (×7): qty 16

## 2015-06-29 MED ORDER — HALOPERIDOL LACTATE 5 MG/ML IJ SOLN
1.0000 mg | Freq: Four times a day (QID) | INTRAMUSCULAR | Status: DC | PRN
  Administered 2015-06-30: 1 mg via INTRAVENOUS
  Filled 2015-06-29: qty 1

## 2015-06-29 MED ORDER — ZIPRASIDONE MESYLATE 20 MG IM SOLR
5.0000 mg | Freq: Once | INTRAMUSCULAR | Status: DC
Start: 2015-06-29 — End: 2015-06-29
  Filled 2015-06-29: qty 20

## 2015-06-29 MED ORDER — LORAZEPAM 2 MG/ML IJ SOLN
0.5000 mg | Freq: Once | INTRAMUSCULAR | Status: AC
  Administered 2015-06-29: 0.5 mg via INTRAVENOUS
  Filled 2015-06-29: qty 1

## 2015-06-29 MED ORDER — WARFARIN SODIUM 1 MG PO TABS: 1.0000 mg | ORAL_TABLET | Freq: Once | ORAL | Status: DC

## 2015-06-29 MED ORDER — WHITE PETROLATUM GEL
Status: AC
  Administered 2015-06-29: 1
  Filled 2015-06-29: qty 1

## 2015-06-29 MED ORDER — FENTANYL CITRATE (PF) 100 MCG/2ML IJ SOLN
25.0000 ug | INTRAMUSCULAR | Status: DC | PRN
  Administered 2015-06-29: 12.5 ug via INTRAVENOUS
  Administered 2015-06-29 (×2): 25 ug via INTRAVENOUS
  Administered 2015-06-29: 12.5 ug via INTRAVENOUS
  Administered 2015-06-29 – 2015-06-30 (×8): 25 ug via INTRAVENOUS
  Filled 2015-06-29 (×12): qty 2

## 2015-06-29 MED ORDER — MORPHINE SULFATE (PF) 2 MG/ML IV SOLN: 2.0000 mg | INTRAVENOUS | Status: DC | PRN

## 2015-06-29 MED ORDER — FUROSEMIDE 10 MG/ML IJ SOLN
80.0000 mg | Freq: Once | INTRAMUSCULAR | Status: AC
  Administered 2015-06-29: 80 mg via INTRAVENOUS
  Filled 2015-06-29: qty 8

## 2015-06-29 NOTE — Progress Notes (Addendum)
PROGRESS NOTE  George Noble  [id number] DOB: 01/15/1925 DOA: 06/24/2015 PCP: [name], MD  Brief Narrative:   82 year old male, recently moved from [place] in April 2017, staying with his daughter, with history of coronary artery disease, A. fib on Anticoagulation, rheumatoid arthritis,arthritis, chronic kidney disease stage III (creatinine of 2.4 months back in [place], worsened to 3.4 one month back) chronic anemia, lymphoma, recent fall with T12-L1 compression fracture/rib fractures, hypertension, presented with SIRS with temperature 102F and acute encephalopathy in the setting of lobar pneumonia and UTI. On 6/8, worsening respiratory started secondary to pulmonary edema and pneumonia. Discussed extensively with patient's daughter Ms. George Noble, clarified goals of care and transferred to stepdown unit on BiPAP (no CPR or intubation). Hope is for clinical improvement, DC to SNF for rehabilitation and be able to attend granddaughter's wedding in 4 weeks.  Assessment & Plan:   Active Problems:   Atrial fibrillation, permanent (HCC)   Seropositive rheumatoid arthritis of multiple sites (HCC)   Chronic kidney disease   HTN (hypertension)   Anemia, iron deficiency   UTI (lower urinary tract infection)   CAP (community acquired pneumonia)   Acute encephalopathy   CAD (coronary artery disease), native coronary artery   Acute on chronic renal failure (HCC)   Pressure ulcer   Protein-calorie malnutrition, severe   Palliative care encounter   SOB (shortness of breath)   DNR (do not resuscitate)  Acute respiratory failure with hypoxia secondary to pneumonia and pulmonary edema - Treating aggressively for above causes. Despite this, progressive worsening of respiratory status on 6/8. Patient was transferred down to stepdown unit with plans for BiPAP. Overnight 6/8, patient with worsening dyspnea, agitation and would not allow BiPAP. - Discussed extensively with patient's  daughter/Ms George Noble and her husband at bedside. At this time plan is to increase Lasix to try and improve diuresis, continue antibiotics with clear understanding that he is likely to decline rapidly and die. There focuses mainly of comfort. They are awaiting family members to come into town from outside. - Discussed with palliative care and nephrology.  Community-acquired pneumonia/? Aspiration pneumonia - Initially started on IV Rocephin and azithromycin which were broadened to vancomycin and Zosyn on 6/8. Chest x-ray shows progressive worsening.  Acute CHF, unknown type - Increasing IV Lasix per nephrology which will help with pulmonary edema and comfort.  Acute encephalopathy - Secondary to acute medical illness.  Acute on chronic kidney disease stage III- IV/mild hyperkalemia/metabolic acidosis, Creatinine of 2.4 from 11/2014 in [place] and more recent creatinine has ranged 3.5-3.7.  Renal ultrasound: polycystic kidneys with evidence of medical renal disease Start sodium bicarbonate As per daughter, patient had seen nephrology/[name] recently as outpatient and determined not a candidate for hemodialysis.  - Patient seen today along with nephrologist. Creatinine slightly worse. Increasing Lasix.  Atrial fibrillation, permanent ([place]), CHADS2VASC 4, anticoagulation indicated Rate controlled, Continue amiodarone. Discussed anticoagulation at length with patient's daughter. Since patient will likely be going to SNF post discharge, she wished to continue Coumadin at this time. INR therapeutic today.  Essential hypertension, blood pressures mildly elevated - continue beta blocker, norvasc  Coronary artery disease Continue beta blocker, statin, imdur  Iron deficiency anemia and anemia of chronic renal disease, folate and B12 wnl.  Iron level low, ferritin elevated from infection and RA -  TSH -  Occult stool -  Repeat hgb in AM  -  Hemoglobin stable. Supposed to see [name] as  outpatient.  Sacral pressure  ulcer Appreciate wound care evaluation.  BPH with evidence of urinary retention on RUS.  Still voiding  Continue Flomax D/c oxybutynin Urology follow up as outpatient.  Constipation -  Bisacodyl suppository -  Start miralax BID -  Senna -  Soap suds enema if bisacodyl suppository doesn't work. No BM yet. Will not force enema today given tenous respiratory status.  Recent fall with T12-L1 compression fracture PT/OT evaluation > SNF SW consult Lidocaine patch to area  Seropositive RA -  Continue prednisone -  Continue ultram and oxycodone. Supposed to follow-up with rheumatology as outpatient.  Severe protein calorie malnutrition -  Regular diet with supplements, appreciate nutrition assistance  DVT prophylaxis:  heparin Code Status:  DO NOT RESUSCITATE Family Communication:  Discussed in detail with patients daughter Ms. George Noble & her husband at bedside. Disposition Plan: Critically ill. To be determined.  Consultants:   Nephrology   Palliative care team  Procedures:  none  Antimicrobials:   Ceftriaxone 6/5 > 6/8  Azithromcyin 6/5 > 6/8  Levofloxacin 6/5 x 1  IV Zosyn and vancomycin 6/8 >   Subjective: Overnight events noted. Ongoing confusion and agitation. Worsening dyspnea. Somnolent this morning and unable to provide any history. Family at bedside.  Objective: Filed Vitals:   06/29/15 0545 06/29/15 0800 06/29/15 1141 06/29/15 1504  BP:   133/76   Pulse: 91     Temp:  98.2 F (36.8 C)    TempSrc:  Axillary    Resp: 21     Height:      Weight:      SpO2: 99%   93%    Intake/Output Summary (Last 24 hours) at 06/29/15 1626 Last data filed at 06/29/15 1514  Gross per 24 hour  Intake    480 ml  Output   2700 ml  Net  -2220 ml   Filed Weights   06/23/2015 1901 06/27/15 2002 06/29/15 0311  Weight: 71.215 kg (157 lb) 71.215 kg (157 lb) 68.629 kg (151 lb 4.8 oz)    Examination:  General exam: Pleasant elderly male,  frail Ill-looking lying propped up in bed with mild respiratory distress.  Respiratory system: Decreased breath sounds bilaterally with scattered bilateral crackles but not much wheezing or rhonchi. Mild increased work of breathing. Cardiovascular system: S1 and S2 heard, irregularly irregular. No JVD, murmurs or pedal edema. Telemetry: A. fib with controlled ventricular rate/BBB morphology. Gastrointestinal system: Normal active bowel sounds, soft, moderately distended, mildly TTP without rebound or guarding. MSK:  Normal tone and bulk, no leg edema. Neuro:  Grossly moves all extremities. Somnolent and mumbles incomprehensively. Does not follow instructions.  Psych:  Confused.    Data Reviewed: I have personally reviewed following labs and imaging studies  CBC:  Recent Labs Lab 07/10/2015 1330 06/26/15 0753 06/27/15 0655 06/28/15 0330 06/29/15 0426  WBC 12.5* 8.0 8.6 9.7 9.5  NEUTROABS 11.3* 6.8  --   --   --   HGB 9.2* 7.9* 7.7* 8.0* 7.5*  HCT 28.4* 24.7* 24.1* 24.9* 23.8*  MCV 93.7 92.2 90.6 91.9 90.2  PLT 224 167 166 207 [id number]   Basic Metabolic Panel:  Recent Labs Lab 06/29/2015 2256 06/26/15 0753 06/27/15 0655 06/28/15 0330 06/28/15 1532  NA 136 139 139 138 137  K 5.1 5.2* 4.9 5.3* 5.2*  CL 113* 114* 112* 113* 109  CO2 17* 17* 15* 15* 15*  GLUCOSE 97 89 95 114* 115*  BUN 66* 65* 59* 58* 66*  CREATININE 4.19* 4.18* 4.03* 4.03* 4.47*  CALCIUM  7.6* 7.8* 8.0* 8.2* 8.0*  MG  --   --   --  1.7  --   PHOS  --   --   --  4.4 5.2*   GFR: Estimated Creatinine Clearance: 11.1 mL/min (by C-G formula based on Cr of 4.47). Liver Function Tests:  Recent Labs Lab 07/15/2015 1330 06/26/15 0753 06/28/15 0330 06/28/15 1532  AST 22 22  --   --   ALT 17 18  --   --   ALKPHOS 45 41  --   --   BILITOT 1.0 0.9  --   --   PROT 5.8* 4.7*  --   --   ALBUMIN 2.8* 2.1* 2.2* 2.2*   No results for input(s): LIPASE, AMYLASE in the last 168 hours. No results for input(s): AMMONIA in the  last 168 hours. Coagulation Profile:  Recent Labs Lab 07/20/2015 1330 06/27/15 0655 06/28/15 0902 06/29/15 0426  INR 1.56* 1.85* 2.11* 2.43*   Cardiac Enzymes: No results for input(s): CKTOTAL, CKMB, CKMBINDEX, TROPONINI in the last 168 hours. BNP (last 3 results) No results for input(s): PROBNP in the last 8760 hours. HbA1C: No results for input(s): HGBA1C in the last 72 hours. CBG: No results for input(s): GLUCAP in the last 168 hours. Lipid Profile: No results for input(s): CHOL, HDL, LDLCALC, TRIG, CHOLHDL, LDLDIRECT in the last 72 hours. Thyroid Function Tests: No results for input(s): TSH, T4TOTAL, FREET4, T3FREE, THYROIDAB in the last 72 hours. Anemia Panel: No results for input(s): VITAMINB12, FOLATE, FERRITIN, TIBC, IRON, RETICCTPCT in the last 72 hours. Urine analysis:    Component Value Date/Time   COLORURINE YELLOW 06/29/2015 1255   APPEARANCEUR TURBID* 06/26/2015 1255   LABSPEC 1.014 06/28/2015 1255   PHURINE 8.0 07/17/2015 1255   GLUCOSEU NEGATIVE 07/03/2015 1255   HGBUR TRACE* 07/10/2015 [place] 07/10/2015 1255   KETONESUR NEGATIVE 07/04/2015 1255   PROTEINUR >300* 07/11/2015 1255   NITRITE NEGATIVE 07/06/2015 1255   LEUKOCYTESUR SMALL* 07/20/2015 1255   Sepsis Labs: @LABRCNTIP (procalcitonin:4,lacticidven:4)  ) Recent Results (from the past 240 hour(s))  Urine culture     Status: Abnormal   Collection Time: 07/15/2015 12:55 PM  Result Value Ref Range Status   Specimen Description URINE, CLEAN CATCH  Final   Special Requests NONE  Final   Culture MULTIPLE SPECIES PRESENT, SUGGEST RECOLLECTION (A)  Final   Report Status 06/27/2015 FINAL  Final  Culture, sputum-assessment     Status: None   Collection Time: 06/26/15  1:54 AM  Result Value Ref Range Status   Specimen Description EXPECTORATED SPUTUM  Final   Special Requests Immunocompromised  Final   Sputum evaluation   Final    THIS SPECIMEN IS ACCEPTABLE. RESPIRATORY CULTURE  REPORT TO FOLLOW.   Report Status 06/26/2015 FINAL  Final  Culture, respiratory (NON-Expectorated)     Status: None   Collection Time: 06/26/15  1:54 AM  Result Value Ref Range Status   Specimen Description SPUTUM  Final   Special Requests NONE  Final   Gram Stain   Final    MODERATE WBC PRESENT,BOTH PMN AND MONONUCLEAR FEW GRAM POSITIVE COCCI IN PAIRS    Culture Consistent with normal respiratory flora.  Final   Report Status 06/28/2015 FINAL  Final      Radiology Studies: Dg Chest Port 1 View  06/29/2015  CLINICAL DATA:  Shortness of breath, community-acquired pneumonia, coronary artery and valvular heart disease status post CABG, lymphoma EXAM: PORTABLE CHEST 1 VIEW COMPARISON:  Portable chest  x-ray of June 28, 2015. FINDINGS: The lungs are adequately inflated. Interstitial opacities have worsened since the previous study. Areas of confluence have developed more fully. There is no pneumothorax or pleural effusion. The cardiac silhouette remains enlarged. The pulmonary vascularity is obscured. There are post median sternotomy changes. Thus calcification in the wall of the thoracic aorta. IMPRESSION: Worsening of airspace opacities bilaterally consistent with pulmonary edema or progressive pneumonia. Stable cardiomegaly. A small right pleural effusion is suspected. Electronically Signed   By: David  Martinique M.D.   On: 06/29/2015 07:29   Dg Chest Port 1 View  06/28/2015  CLINICAL DATA:  Shortness of breath EXAM: PORTABLE CHEST 1 VIEW COMPARISON:  07/17/2015; left rib radiographic series- 06/07/2015 FINDINGS: Grossly unchanged enlarged cardiac silhouette and mediastinal contours given AP projection and decreased lung volumes. Pulmonary vasculature is indistinct with cephalization of flow and development / progression of extensive perihilar heterogeneous airspace opacities. Trace bilateral effusions are suspected though suboptimally evaluated secondary exclusion of the bilateral hemidiaphragms. No  pneumothorax. Old left-sided lateral rib fractures. No definite acute osseous abnormalities. IMPRESSION: Suspected worsening / development of alveolar pulmonary edema though note, underlying infection and/or aspiration is not excluded. Electronically Signed   By: Sandi Mariscal M.D.   On: 06/28/2015 09:05     Scheduled Meds: . amiodarone  200 mg Oral Daily  . amLODipine  5 mg Oral Daily  . antiseptic oral rinse  7 mL Mouth Rinse BID  . aspirin EC  81 mg Oral Daily  . atorvastatin  20 mg Oral Daily  . bisacodyl  10 mg Rectal Daily  . calcitRIOL  0.25 mcg Oral Daily  . feeding supplement (NEPRO CARB STEADY)  237 mL Oral BID BM  . furosemide  160 mg Intravenous Q8H  . guaiFENesin  600 mg Oral BID  . ipratropium-albuterol  3 mL Nebulization Q6H  . isosorbide mononitrate  30 mg Oral Daily  . lidocaine  2 patch Transdermal Q24H  . metoprolol succinate  25 mg Oral Daily  . pantoprazole  40 mg Oral Daily  . piperacillin-tazobactam (ZOSYN)  IV  2.25 g Intravenous Q6H  . polyethylene glycol  17 g Oral BID  . predniSONE  10 mg Oral Q breakfast  . senna  2 tablet Oral QHS  . sodium bicarbonate  1,300 mg Oral BID  . tamsulosin  0.4 mg Oral Daily  . vancomycin  1,000 mg Intravenous Q48H  . warfarin  1 mg Oral ONCE-1800  . Warfarin - Pharmacist Dosing Inpatient   Does not apply q1800   Continuous Infusions:    LOS: 4 days    Time spent: 85 min    Lutisha Knoche, MD [facility] Pager [phone]  If 7PM-7AM, please contact night-coverage [email or web address] Password [email or web address] 06/29/2015, 4:26 PM

## 2015-06-29 NOTE — Progress Notes (Signed)
PT Cancellation Note  Patient Details Name: George Noble MRN: [id number] DOB: Feb 19, 1924   Cancelled Treatment:    Reason Eval/Treat Not Completed: Medical issues which prohibited therapy.  Pt has had a rough few days mentally and physically.  He is certainly not appropriate to see today. Palliative team now on board and family seeking comfort care.  Daughter in room and agreeable for PT to check back Monday on pt's status.  I would like to give pt the weekend before PT signs off, but prognosis is poor.  Family flying in from out of town as pt is nearing end of life.   Thanks,  Barbarann Ehlers. [place], [place], DPT [phone]   06/29/2015, 9:40 AM

## 2015-06-29 NOTE — Progress Notes (Signed)
Daily Progress Note   Patient Name: George Noble       Date: 06/29/2015 DOB: [birth year]  Age: 82 y.o. MRN#: [id number] Attending Physician: Modena Jansky, MD Primary Care Physician: [name], MD Admit Date: 06/21/2015  Reason for Consultation/Follow-up: Establishing goals of care  Subjective: I had another conversation today with daughter [name] and her husband. After discussion with Dr. Algis Liming and assessing George Noble this morning I am very concerned. I hear very little air movement and his toes are cold bilat. He is pale and mostly unresponsive. This is a significant change from my visit yesterday evening when he was sitting up and joking with me. I was definitely more optimistic yesterday. Unfortunately today I fear he could die very soon. I discussed my findings and concern with family. They are understandably tearful. [name] is working to help her siblings get here as soon as possible and this is now her main goal that her siblings can get her before he passes if at all possible. They do agree that we should not allow him to suffer and will add/change medications for comfort. They also request that sitter not be at bedside as family will be there. Discussed with RN and Dr. Algis Liming.   Length of Stay: 4  Current Medications: Scheduled Meds:  . amiodarone  200 mg Oral Daily  . amLODipine  5 mg Oral Daily  . antiseptic oral rinse  7 mL Mouth Rinse BID  . aspirin EC  81 mg Oral Daily  . atorvastatin  20 mg Oral Daily  . bisacodyl  10 mg Rectal Daily  . calcitRIOL  0.25 mcg Oral Daily  . feeding supplement (NEPRO CARB STEADY)  237 mL Oral BID BM  . furosemide  160 mg Intravenous Q8H  . furosemide  80 mg Intravenous Once  . guaiFENesin  600 mg Oral BID  . ipratropium-albuterol  3 mL  Nebulization Q6H  . isosorbide mononitrate  30 mg Oral Daily  . lidocaine  2 patch Transdermal Q24H  . metoprolol succinate  25 mg Oral Daily  . pantoprazole  40 mg Oral Daily  . piperacillin-tazobactam (ZOSYN)  IV  2.25 g Intravenous Q6H  . polyethylene glycol  17 g Oral BID  . predniSONE  10 mg Oral Q breakfast  . senna  2 tablet Oral QHS  .  sodium bicarbonate  1,300 mg Oral BID  . tamsulosin  0.4 mg Oral Daily  . vancomycin  1,000 mg Intravenous Q48H  . Warfarin - Pharmacist Dosing Inpatient   Does not apply q1800    Continuous Infusions:    PRN Meds: acetaminophen, albuterol, bisacodyl, fentaNYL (SUBLIMAZE) injection, haloperidol lactate  Physical Exam  Constitutional:  Pale  HENT:  Head: Normocephalic and atraumatic.  Cardiovascular: Normal rate.   Pulmonary/Chest: No accessory muscle usage. No tachypnea. No respiratory distress. He has decreased breath sounds in the right upper field, the right middle field, the right lower field, the left upper field, the left middle field and the left lower field.  Limited air movement heard. Fine crackles in bases  Abdominal: Soft. Normal appearance.  Neurological:  Obtunded. Did not respond to stimulation.             Vital Signs: BP 150/78 mmHg  Pulse 91  Temp(Src) 98.3 F (36.8 C) (Axillary)  Resp 21  Ht 6' (1.829 m)  Wt 68.629 kg (151 lb 4.8 oz)  BMI 20.52 kg/m2  SpO2 99% SpO2: SpO2: 99 % O2 Device: O2 Device: High Flow Nasal Cannula O2 Flow Rate: O2 Flow Rate (L/min): 50 L/min  Intake/output summary:  Intake/Output Summary (Last 24 hours) at 06/29/15 0827 Last data filed at 06/29/15 [id number]  Gross per 24 hour  Intake    680 ml  Output   2300 ml  Net  -1620 ml   LBM: Last BM Date: 06/28/15 (per chart) Baseline Weight: Weight: 66.225 kg (146 lb) Most recent weight: Weight: 68.629 kg (151 lb 4.8 oz)       Palliative Assessment/Data: PPS: 10%     Patient Active Problem List   Diagnosis Date Noted  .  Protein-calorie malnutrition, severe 06/27/2015  . Pressure ulcer 06/26/2015  . UTI (lower urinary tract infection) 07/15/2015  . CAP (community acquired pneumonia) 07/02/2015  . Acute encephalopathy 07/07/2015  . CAD (coronary artery disease), native coronary artery 07/01/2015  . Acute on chronic renal failure ([place]) 07/12/2015  . HTN (hypertension) 06/10/2015  . Lymphoma ([place]) 06/10/2015  . Anemia, iron deficiency 06/10/2015  . PCP NOTES >>>>>>>>>>>>>>>>>>>>> 06/10/2015  . Atrial fibrillation, permanent ([place])   . Hyperlipidemia   . Chronic kidney disease   . Seropositive rheumatoid arthritis of multiple sites [facility]) 11/20/2013    Palliative Care Assessment & Plan   Patient Profile: 82 yo male with history of coronary artery disease, A. fib on Anticoagulation, rheumatoid arthritis,arthritis, chronic kidney disease stage III (creatinine of 2.4 months back in [place], worsened to 3.4 one month back) chronic anemia, lymphoma, recent fall with T12-L1 compression fracture/rib fractures, hypertension, presented with SIRS with temperature 102F and acute encephalopathy in the setting of lobar pneumonia and UTI. On 6/8, worsening respiratory started secondary to pulmonary edema and pneumonia. Daughter [name] recognized that he probably would not live long but he came from [place] to see his grandchildren graduate university and hopeful to attend other granddaughter's wedding in 4.5 weeks. [name] was hoping that her father would continue to stay with her here in [place] for the rest of his life as he was independently living in [place]. She was beginning to establish him with providers in this area. Unfortunately George Noble has deteriorated overnight.   Assessment: George Noble is not responding.   Recommendations/Plan:  Pain/dyspnea: Fentanyl 25 mcg every hour prn.   Delirium/agitation: Haldol 1 mg every 6 hours prn.   Goals of Care and Additional Recommendations:  Limitations on  Scope of Treatment:  Continue supportive care but will not escalate and will also keep comfortable.  Code Status:    Code Status Orders        Start     Ordered   06/29/15 0824  Do not attempt resuscitation (DNR)   Continuous    Question Answer Comment  In the event of cardiac or respiratory ARREST Do not call a "code blue"   In the event of cardiac or respiratory ARREST Do not perform Intubation, CPR, defibrillation or ACLS   In the event of cardiac or respiratory ARREST Use medication by any route, position, wound care, and other measures to relive pain and suffering. May use oxygen, suction and manual treatment of airway obstruction as needed for comfort.      06/29/15 0824    Code Status History    Date Active Date Inactive Code Status Order ID Comments User Context   07/02/2015 11:41 PM 06/29/2015  8:24 AM Partial Code [id number]  Toy Baker, MD Inpatient    Advance Directive Documentation        Most Recent Value   Type of Advance Directive  Healthcare Power of Attorney, Living will   Pre-existing out of facility DNR order (yellow form or pink MOST form)     "MOST" Form in Place?         Prognosis:   Hours - Days  Discharge Planning:  Anticipated Hospital Death  Care plan was discussed with Dr. Algis Liming and RN.   Thank you for allowing the Palliative Medicine Team to assist in the care of this patient.   Time In: 0800 Time Out: 0840 Total Time 22min Prolonged Time Billed  no       Greater than 50%  of this time was spent counseling and coordinating care related to the above assessment and plan.  Pershing Proud, NP  Please contact Palliative Medicine Team phone at [phone] for questions and concerns.

## 2015-06-29 NOTE — Progress Notes (Signed)
S: Obtunded.  Difficult night with agitation and SOB requiring sedatives O:BP 150/78 mmHg  Pulse 91  Temp(Src) 98.3 F (36.8 C) (Axillary)  Resp 21  Ht 6' (1.829 m)  Wt 68.629 kg (151 lb 4.8 oz)  BMI 20.52 kg/m2  SpO2 99%  Intake/Output Summary (Last 24 hours) at 06/29/15 0731 Last data filed at 06/29/15 0313  Gross per 24 hour  Intake    800 ml  Output   2300 ml  Net  -1500 ml   Weight change: -2.586 kg (-5 lb 11.2 oz) JFH:[id number].  Appears to be breathing comfortably with O2 sat 94% CVS: irreg,irreg Resp:Bil crackles and scattered wheezes Abd:+ BS ND Ext: no edema NEURO: Obtunded   . amiodarone  200 mg Oral Daily  . amLODipine  5 mg Oral Daily  . antiseptic oral rinse  7 mL Mouth Rinse BID  . aspirin EC  81 mg Oral Daily  . atorvastatin  20 mg Oral Daily  . bisacodyl  10 mg Rectal Daily  . calcitRIOL  0.25 mcg Oral Daily  . feeding supplement (NEPRO CARB STEADY)  237 mL Oral BID BM  . furosemide  80 mg Intravenous Q8H  . guaiFENesin  600 mg Oral BID  . ipratropium-albuterol  3 mL Nebulization Q6H  . isosorbide mononitrate  30 mg Oral Daily  . lidocaine  2 patch Transdermal Q24H  . metoprolol succinate  25 mg Oral Daily  . pantoprazole  40 mg Oral Daily  . piperacillin-tazobactam (ZOSYN)  IV  2.25 g Intravenous Q6H  . polyethylene glycol  17 g Oral BID  . predniSONE  10 mg Oral Q breakfast  . senna  2 tablet Oral QHS  . sodium bicarbonate  1,300 mg Oral BID  . tamsulosin  0.4 mg Oral Daily  . vancomycin  1,000 mg Intravenous Q48H  . Warfarin - Pharmacist Dosing Inpatient   Does not apply q1800  . ziprasidone  5-10 mg Intramuscular Once   Dg Chest Port 1 View  06/28/2015  CLINICAL DATA:  Shortness of breath EXAM: PORTABLE CHEST 1 VIEW COMPARISON:  06/26/2015; left rib radiographic series- 06/07/2015 FINDINGS: Grossly unchanged enlarged cardiac silhouette and mediastinal contours given AP projection and decreased lung volumes. Pulmonary vasculature is indistinct  with cephalization of flow and development / progression of extensive perihilar heterogeneous airspace opacities. Trace bilateral effusions are suspected though suboptimally evaluated secondary exclusion of the bilateral hemidiaphragms. No pneumothorax. Old left-sided lateral rib fractures. No definite acute osseous abnormalities. IMPRESSION: Suspected worsening / development of alveolar pulmonary edema though note, underlying infection and/or aspiration is not excluded. Electronically Signed   By: Sandi Mariscal M.D.   On: 06/28/2015 09:05   BMET    Component Value Date/Time   NA 137 06/28/2015 1532   NA 136* 05/10/2015   K 5.2* 06/28/2015 1532   CL 109 06/28/2015 1532   CO2 15* 06/28/2015 1532   GLUCOSE 115* 06/28/2015 1532   BUN 66* 06/28/2015 1532   BUN 52* 05/10/2015   CREATININE 4.47* 06/28/2015 1532   CREATININE 3.5* 05/10/2015   CALCIUM 8.0* 06/28/2015 1532   GFRNONAA 11* 06/28/2015 1532   GFRAA 12* 06/28/2015 1532   CBC    Component Value Date/Time   WBC 9.5 06/29/2015 0426   WBC 7.6 05/10/2015   RBC 2.64* 06/29/2015 0426   RBC 2.68* 06/26/2015 0753   HGB 7.5* 06/29/2015 0426   HCT 23.8* 06/29/2015 0426   PLT 185 06/29/2015 0426   MCV 90.2 06/29/2015 0426  MCH 28.4 06/29/2015 0426   MCHC 31.5 06/29/2015 0426   RDW 14.9 06/29/2015 0426   LYMPHSABS 0.7 06/26/2015 0753   MONOABS 0.4 06/26/2015 0753   EOSABS 0.1 06/26/2015 0753   BASOSABS 0.0 06/26/2015 0753     Assessment: 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 Pulm edema +/- PNA, resp status is worsening 3. 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 Met acidosis 5. Obtundation  ? Sedatives, ? hypercapnia  Plan: 1.  Family moving toward comfort care and does not want intubation or dialysis 2.  Discussed increasing lasix to help with pulm edema but not escalating care beyond that and if condition cont to worsen move more towards comfort care.  He actually made >2L urine yest so concerned that some of worsening CXR could be due to aspiration or cardiac  event.  Discussed this with daughter.   Joshuajames Moehring T

## 2015-06-29 NOTE — Progress Notes (Signed)
Patient becoming increasingly agitated with increased work of breathing, increased wheezing, increased coughing. Roger Shelter NP notified; 0.5 mg IV ativan given at 0111 did not seem to vastly improve patient's agitation. Patient had a Air cabin crew in the room from midnight-0300 to assist with patient safety. Safety mitts were applied to help prevent patient from pulling off his oxygen mask. Roger Shelter NP notified again because patient began maintaining sats in the 80s with increased agitation. Orders for morphine 2 mg q 1hr PRN. Patient still very agitated and attempting to pull of mask with increased work of breathing. Respiratory called to assess patient. High flow nasal cannula set up at bedside since patient not tolerating nonrebreather mask. Patient's daughter called and updated about situation.

## 2015-06-29 NOTE — Progress Notes (Signed)
Echocardiogram 2D Echocardiogram has been performed.  George Noble 06/29/2015, 2:33 PM

## 2015-06-29 NOTE — Progress Notes (Signed)
RT called by RN to assess patients respiratory status.  Upon entering room, patient appears agitated and restless.  SpO2 88-90% on NRB mask.  Accessory muscle use noted with expiratory wheezing.  Patient would benefit the most from [place], however, due to mental status/agitation RT does not think that patient would tolerate.  Decision was made to transition patient to nasal high flow canula to aide with oxygenation/patient comfort while care goals are addressed with family.

## 2015-06-29 NOTE — Progress Notes (Signed)
OT Cancellation Note  Patient Details Name: George Noble MRN: [id number] DOB: 1924-11-27   Cancelled Treatment:    Reason Eval/Treat Not Completed: Medical issues which prohibited therapy. Pt has been placed on comfort care at this time. PT/OT spoke with family for any needs at this time. Will sign off for now. Please re-consult if needs or situation changes.   Redmond Baseman, OTR/L Pager: [phone] 06/29/2015, 9:42 AM

## 2015-06-29 NOTE — Progress Notes (Signed)
ANTICOAGULATION CONSULT NOTE - Follow Up Consult  Pharmacy Consult for Warfarin Indication: atrial fibrillation  Allergies  Allergen Reactions  . Penicillins Other (See Comments)    Unsure of reaction  . Sulfa Antibiotics Other (See Comments)    Unsure of reaction    Patient Measurements: Height: 6' (182.9 cm) Weight: 151 lb 4.8 oz (68.629 kg) IBW/kg (Calculated) : 77.6 Heparin Dosing Weight: 71 kg  Vital Signs: Temp: 98.3 F (36.8 C) (06/09 0311) Temp Source: Axillary (06/09 0311) Pulse Rate: 91 (06/09 0545)  Labs:  Recent Labs  06/27/15 0655 06/28/15 0330 06/28/15 0902 06/28/15 1532 06/29/15 0426  HGB 7.7* 8.0*  --   --  7.5*  HCT 24.1* 24.9*  --   --  23.8*  PLT 166 207  --   --  185  LABPROT 21.3*  --  23.5*  --  26.1*  INR 1.85*  --  2.11*  --  2.43*  CREATININE 4.03* 4.03*  --  4.47*  --     Estimated Creatinine Clearance: 11.1 mL/min (by C-G formula based on Cr of 4.47).   Assessment: 29 YOM who presented on 6/5 with fever and confusion. The patient was on warfarin PTA for history of Afib -  Admit INR 1.56 on PTA dose of 1.5 mg/day. On amiodarone PTA/ resumed inpatient.   INR this AM = 2.43. No coumadin given 6/7.  No bleeding reported.  Goals of care transitioning to comfort/supportive care.  Goal of Therapy:  INR 2-3   Plan:  Warfarin 1 mg x 1 dose at 1800 today Daily PT/INR Will continue to monitor for any signs/symptoms of bleeding and will follow up with PT/INR in the a.m.    Thank you for allowing [place] to participate in this patients care. Jens Som, PharmD Pager: [phone] 06/29/2015 10:04 AM

## 2015-06-29 NOTE — Clinical Social Work Note (Addendum)
Patient now transitioning to comfort care per palliative and anticipating hospital death, CSW to continue to monitor patient's progress.  George Noble, MSW, [place] 06/29/2015 5:21 PM

## 2015-06-30 DIAGNOSIS — J9601 Acute respiratory failure with hypoxia: Secondary | ICD-10-CM

## 2015-06-30 DIAGNOSIS — D509 Iron deficiency anemia, unspecified: Secondary | ICD-10-CM

## 2015-06-30 DIAGNOSIS — G9340 Encephalopathy, unspecified: Secondary | ICD-10-CM

## 2015-06-30 DIAGNOSIS — R1084 Generalized abdominal pain: Secondary | ICD-10-CM

## 2015-06-30 DIAGNOSIS — N189 Chronic kidney disease, unspecified: Secondary | ICD-10-CM

## 2015-06-30 DIAGNOSIS — N179 Acute kidney failure, unspecified: Secondary | ICD-10-CM

## 2015-06-30 DIAGNOSIS — I482 Chronic atrial fibrillation: Secondary | ICD-10-CM

## 2015-06-30 MED ORDER — PIPERACILLIN-TAZOBACTAM IN DEX 2-0.25 GM/50ML IV SOLN
2.2500 g | Freq: Three times a day (TID) | INTRAVENOUS | Status: DC
  Filled 2015-06-30: qty 50

## 2015-06-30 MED ORDER — FENTANYL BOLUS VIA INFUSION
25.0000 ug | INTRAVENOUS | Status: DC | PRN
Start: 2015-06-30 — End: 2015-07-01
  Administered 2015-06-30 (×4): 25 ug via INTRAVENOUS
  Filled 2015-06-30: qty 25

## 2015-06-30 MED ORDER — SPIRITUS FRUMENTI
1.0000 | ORAL | Status: DC | PRN
  Filled 2015-06-30: qty 1

## 2015-06-30 MED ORDER — FENTANYL CITRATE (PF) 100 MCG/2ML IJ SOLN: 25.0000 ug | INTRAMUSCULAR | Status: DC | PRN

## 2015-06-30 MED ORDER — ONDANSETRON HCL 4 MG/2ML IJ SOLN: 4.0000 mg | Freq: Four times a day (QID) | INTRAMUSCULAR | Status: DC | PRN

## 2015-06-30 MED ORDER — BIOTENE DRY MOUTH MT LIQD: 15.0000 mL | OROMUCOSAL | Status: DC | PRN

## 2015-06-30 MED ORDER — GLYCOPYRROLATE 1 MG PO TABS
1.0000 mg | ORAL_TABLET | ORAL | Status: DC | PRN
  Filled 2015-06-30: qty 1

## 2015-06-30 MED ORDER — POLYVINYL ALCOHOL 1.4 % OP SOLN
1.0000 [drp] | Freq: Four times a day (QID) | OPHTHALMIC | Status: DC | PRN
  Filled 2015-06-30: qty 15

## 2015-06-30 MED ORDER — ACETAMINOPHEN 650 MG RE SUPP: 650.0000 mg | Freq: Four times a day (QID) | RECTAL | Status: DC | PRN

## 2015-06-30 MED ORDER — SODIUM CHLORIDE 0.9 % IV SOLN
25.0000 ug/h | INTRAVENOUS | Status: DC
  Administered 2015-06-30: 25 ug/h via INTRAVENOUS
  Filled 2015-06-30: qty 50

## 2015-06-30 MED ORDER — GLYCOPYRROLATE 0.2 MG/ML IJ SOLN
0.2000 mg | INTRAMUSCULAR | Status: DC | PRN
  Filled 2015-06-30: qty 1

## 2015-06-30 MED ORDER — ONDANSETRON 4 MG PO TBDP
4.0000 mg | ORAL_TABLET | Freq: Four times a day (QID) | ORAL | Status: DC | PRN
  Filled 2015-06-30: qty 1

## 2015-06-30 MED ORDER — ACETAMINOPHEN 325 MG PO TABS: 650.0000 mg | ORAL_TABLET | Freq: Four times a day (QID) | ORAL | Status: DC | PRN

## 2015-07-03 NOTE — Telephone Encounter (Signed)
Received OV notes from Dr. Judeth Porch office. Records forwarded to Dr. Larose Kells.

## 2015-07-03 NOTE — Telephone Encounter (Signed)
Melissa at [facility] informed that Pt is deceased and orders no longer needed.

## 2015-07-04 ENCOUNTER — Ambulatory Visit: Payer: BLUE CROSS/BLUE SHIELD | Admitting: Hematology & Oncology

## 2015-07-04 ENCOUNTER — Ambulatory Visit: Payer: BLUE CROSS/BLUE SHIELD

## 2015-07-04 ENCOUNTER — Other Ambulatory Visit: Payer: BLUE CROSS/BLUE SHIELD

## 2015-07-06 NOTE — Telephone Encounter (Signed)
More than 50 pages of records , mostly repetitive w/  the same information reviewed, only the last few notes will be kept and scanned.

## 2015-07-18 ENCOUNTER — Ambulatory Visit: Payer: BLUE CROSS/BLUE SHIELD | Admitting: Cardiology

## 2015-07-21 NOTE — Progress Notes (Signed)
[facility]                                                                              Patient Demographics  George Noble, is a 82 y.o. male, DOB - 06-10-1924, BB:[id number]  Admit date - 07/02/2015   Admitting Physician George Baker, MD  Outpatient Primary MD for the patient is George November, MD  Outpatient specialists:   LOS - 5  days    Chief Complaint  Patient presents with  . Fever  . Altered Mental Status       Brief summary   82 year old male, recently moved from [place] in April 2017, staying with his daughter, with history of coronary artery disease, A. fib on Anticoagulation, rheumatoid arthritis,arthritis, chronic kidney disease stage III (creatinine of 2.4 months back in [place], worsened to 3.4 one month back) chronic anemia, lymphoma, recent fall with T12-L1 compression fracture/rib fractures, hypertension, presented with SIRS with temperature 102F and acute encephalopathy in the setting of lobar pneumonia and UTI. On 6/8, worsening respiratory started secondary to pulmonary edema and pneumonia. Discussed extensively with patient's daughter Ms. George Noble, clarified goals of care and transferred to stepdown unit on BiPAP (no CPR or intubation).  Goals of care on 6/9, patient was made comfort care status per family's wishes due to worsening dyspnea and respiratory distress.   Assessment & Plan    Acute respiratory failure with hypoxia secondary to pneumonia and pulmonary edema -Patient has been aggressively managed for the respiratory failure, was transferred down to stepdown unit with plans for BiPAP. Overnight 6/8, patient with worsening dyspnea, agitation and would not allow BiPAP. - On 6/9, palliative medicine and attending physician, Dr. Heloise Noble discussed in detail with the patient's family and patient was placed on comfort care status . - Patient however was continued on all his antibiotics and diuretics yesterday, however was  expected to decline - This morning he is alert and awake, conversing and on high flow oxygen with O2 sats 100% on FiO2 100%.  - Nephrology also following  Community-acquired pneumonia/? Aspiration pneumonia - Initially started on IV Rocephin and azithromycin which were broadened to vancomycin and Zosyn on 6/8. Chest x-ray showed progressive worsening.  Acute CHF, unknown type - Increasing IV Lasix per nephrology which will help with pulmonary edema and comfort.  Acute encephalopathy - Secondary to acute medical illness.  Acute on chronic kidney disease stage III- IV/mild hyperkalemia/metabolic acidosis, Creatinine of 2.4 from 11/2014 in [place] and more recent creatinine has ranged 3.5-3.7. No labs this morning Renal ultrasound showed polycystic kidneys with evidence of medical renal disease On sodium bicarbonate As per daughter, patient had seen nephrology/Dr. Lorrene Noble recently as outpatient and determined not a candidate for hemodialysis.   Atrial fibrillation, permanent ([name]), CHADS2VASC 4, anticoagulation indicated Rate controlled, Continue amiodarone. Discussed anticoagulation at length with patient's daughter. Since patient will likely be going to SNF post discharge, she wished to continue Coumadin at this time. No labs today  Essential hypertension, blood pressures mildly elevated - continue beta blocker, norvasc  Coronary artery disease Continue beta blocker, statin, imdur  Iron deficiency anemia and anemia of chronic  renal disease, folate and B12 wnl. Iron level low, ferritin elevated from infection and RA - Supposed to see Dr. Marin Noble as outpatient.  Sacral pressure ulcer Appreciate wound care evaluation.  BPH with evidence of urinary retention on RUS. - Still voiding  - Continue Flomax, discontinue oxybutynin  Constipation - Bisacodyl suppository, continue MiraLAX, senna - Soapsuds enema as needed  Recent fall with T12-L1 compression fracture PT/OT  evaluation > SNF SW consult Lidocaine patch to area  Seropositive RA - Continue prednisone - Continue ultram and oxycodone. Supposed to follow-up with rheumatology as outpatient.  Severe protein calorie malnutrition - Regular diet with supplements, appreciate nutrition assistance  Code Status: DNR  DVT Prophylaxis:  SCD's Family Communication: No family member at the bedside  Disposition Plan: unclear, it remains alert and awake, may need skilled nursing facility  Time Spent in minutes   25 minutes  Procedures:  None  Consultants:   Nephrology Palliative care  Antimicrobials:   Ceftriaxone 6/5 > 6/8  Azithromcyin 6/5 > 6/8  Levofloxacin 6/5 x 1  IV Zosyn and vancomycin 6/8 >   Medications  Scheduled Meds: . amiodarone  200 mg Oral Daily  . amLODipine  5 mg Oral Daily  . antiseptic oral rinse  7 mL Mouth Rinse BID  . aspirin EC  81 mg Oral Daily  . atorvastatin  20 mg Oral Daily  . bisacodyl  10 mg Rectal Daily  . feeding supplement (NEPRO CARB STEADY)  237 mL Oral BID BM  . furosemide  160 mg Intravenous Q8H  . guaiFENesin  600 mg Oral BID  . ipratropium-albuterol  3 mL Nebulization Q6H  . isosorbide mononitrate  30 mg Oral Daily  . lidocaine  2 patch Transdermal Q24H  . metoprolol succinate  25 mg Oral Daily  . pantoprazole  40 mg Oral Daily  . piperacillin-tazobactam (ZOSYN)  IV  2.25 g Intravenous Q8H  . polyethylene glycol  17 g Oral BID  . predniSONE  10 mg Oral Q breakfast  . senna  2 tablet Oral QHS  . sodium bicarbonate  1,300 mg Oral BID  . tamsulosin  0.4 mg Oral Daily  . vancomycin  1,000 mg Intravenous Q48H  . warfarin  1 mg Oral ONCE-1800  . Warfarin - Pharmacist Dosing Inpatient   Does not apply q1800   Continuous Infusions:  PRN Meds:.acetaminophen, albuterol, bisacodyl, fentaNYL (SUBLIMAZE) injection, haloperidol lactate   Antibiotics   Anti-infectives    Start     Dose/Rate Route Frequency Ordered Stop   07/29/15 1741   piperacillin-tazobactam (ZOSYN) IVPB 2.25 g     2.25 g 100 mL/hr over 30 Minutes Intravenous Every 8 hours 2015/07/29 1029     06/28/15 1200  vancomycin (VANCOCIN) IVPB 1000 mg/200 mL premix     1,000 mg 200 mL/hr over 60 Minutes Intravenous Every 48 hours 06/28/15 1025     06/28/15 1130  piperacillin-tazobactam (ZOSYN) IVPB 2.25 g  Status:  Discontinued     2.25 g 100 mL/hr over 30 Minutes Intravenous Every 6 hours 06/28/15 1025 2015/07/29 1029   06/26/15 1800  azithromycin (ZITHROMAX) 500 mg in dextrose 5 % 250 mL IVPB  Status:  Discontinued     500 mg 250 mL/hr over 60 Minutes Intravenous Daily-1800 07/06/2015 2341 06/28/15 0943   06/26/15 0000  cefTRIAXone (ROCEPHIN) 1 g in dextrose 5 % 50 mL IVPB  Status:  Discontinued     1 g 100 mL/hr over 30 Minutes Intravenous Every 24 hours 06/21/2015 2341 06/28/15  [id number]   06/29/2015 1400  levofloxacin (LEVAQUIN) IVPB 500 mg     500 mg 100 mL/hr over 60 Minutes Intravenous  Once 07/06/2015 1356 07/03/2015 1505        Subjective:   George Noble was seen and examined today.Patient seen and examined, much more alert and awake today, no family member at the bedside. Patient denies dizziness, chest pain, abdominal pain, N/V/D/C, new weakness, numbess, tingling. No acute events overnight.    Objective:   Filed Vitals:   06/29/15 1504 06/29/15 2104 Jul 01, 2015 0304 2015/07/01 0813  BP:      Pulse:  87 96   Temp:      TempSrc:      Resp:  17 21   Height:      Weight:      SpO2: 93% 100% 93% 100%    Intake/Output Summary (Last 24 hours) at 01-Jul-2015 1040 Last data filed at 2015/07/01 0542  Gross per 24 hour  Intake      0 ml  Output   2400 ml  Net  -2400 ml     Wt Readings from Last 3 Encounters:  06/29/15 68.629 kg (151 lb 4.8 oz)  06/07/15 72.689 kg (160 lb 4 oz)     Exam  General: Alert and oriented x 2, NAD  HEENT:    Neck:   Cardiovascular: Irregularly irregular  Respiratory: Bibasal crackles with scattered rhonchi  gastrointestinal:  Soft, nontender, nondistended, + bowel sounds  Ext: no cyanosis clubbing or edema  Neuro: moving all 4 extremities  Skin: No rashes  Psych: Normal affect and demeanor, alert and oriented x2    Data Reviewed:  I have personally reviewed following labs and imaging studies  Micro Results Recent Results (from the past 240 hour(s))  Urine culture     Status: Abnormal   Collection Time: 06/29/2015 12:55 PM  Result Value Ref Range Status   Specimen Description URINE, CLEAN CATCH  Final   Special Requests NONE  Final   Culture MULTIPLE SPECIES PRESENT, SUGGEST RECOLLECTION (A)  Final   Report Status 06/27/2015 FINAL  Final  Culture, sputum-assessment     Status: None   Collection Time: 06/26/15  1:54 AM  Result Value Ref Range Status   Specimen Description EXPECTORATED SPUTUM  Final   Special Requests Immunocompromised  Final   Sputum evaluation   Final    THIS SPECIMEN IS ACCEPTABLE. RESPIRATORY CULTURE REPORT TO FOLLOW.   Report Status 06/26/2015 FINAL  Final  Culture, respiratory (NON-Expectorated)     Status: None   Collection Time: 06/26/15  1:54 AM  Result Value Ref Range Status   Specimen Description SPUTUM  Final   Special Requests NONE  Final   Gram Stain   Final    MODERATE WBC PRESENT,BOTH PMN AND MONONUCLEAR FEW GRAM POSITIVE COCCI IN PAIRS    Culture Consistent with normal respiratory flora.  Final   Report Status 06/28/2015 FINAL  Final    Radiology Reports Dg Chest 2 View  06/29/2015  CLINICAL DATA:  Fall 3 weeks ago.  Fever. EXAM: CHEST  2 VIEW COMPARISON:  None. FINDINGS: Mild cardiomegaly. There is infiltrate in the left perihilar region, extending into the left base with a small effusion. The left hilum is largely obscured. The right hilum and mediastinum are unremarkable. No other acute abnormalities. IMPRESSION: Left lower lobe infiltrate with a small effusion. Given the history of fever, this likely represents pneumonia. However, recommend treatment with a  follow-up chest x-ray in 2 or  3 weeks to ensure resolution. Electronically Signed   By: Dorise Bullion III M.D   On: 07/10/2015 13:29   Dg Ribs Unilateral Left  06/07/2015  CLINICAL DATA:  Contusion of the left posterior chest, left posterior lower rib pain EXAM: LEFT RIBS - 2 VIEW COMPARISON:  None. FINDINGS: There are fractures of the posterolateral left eighth, ninth, tenth rib and possibly the left posterolateral seventh rib. The bones are diffusely osteopenic. Left basilar atelectasis is noted in there may be a small left pleural effusion. No pneumothorax is seen. IMPRESSION: 1. Multiple left posterolateral lower rib fractures from approximately T7 or 8 to T10. 2. Osteopenia. 3. Left basilar atelectasis and possible small left effusion. Electronically Signed   By: Ivar Drape M.D.   On: 06/07/2015 15:59   Dg Thoracic Spine 2 View  07/06/2015  CLINICAL DATA:  Fall 3 weeks ago.  Compression fracture. EXAM: THORACIC SPINE 2 VIEWS COMPARISON:  Thoracic spine image 06/07/2015. Lumbar spine images performed today. FINDINGS: The T12 and L1 compression fractures are better seen on today's lumbar spine series. No additional acute bony abnormality or malalignment. IMPRESSION: Mild T12 and L1 compression fractures, better seen on today's lumbar spine series. Electronically Signed   By: Rolm Baptise M.D.   On: 07/12/2015 13:34   Dg Thoracic Spine W/swimmers  06/07/2015  CLINICAL DATA:  Lower back and left posterior rib pain, chest wall contusion EXAM: THORACIC SPINE - 3 VIEWS COMPARISON:  Dictated report from chest x-ray of 04/25/2013 FINDINGS: The bones are diffusely osteopenic. Median sternotomy sutures are noted. There does appear to be a partial compression deformity of T12 vertebral body of approximately 30%. This compression deformity was not mentioned on the dictated report from 2015. No retropulsion is seen. Intervertebral disc spaces appear normal. IMPRESSION: 30% compression deformity of T12 vertebral  body of uncertain age. No significant retropulsion. Electronically Signed   By: Ivar Drape M.D.   On: 06/07/2015 15:57   Dg Lumbar Spine Complete  07/09/2015  CLINICAL DATA:  Fall 3 weeks ago with compression fracture and rib fractures. Persistent pain. EXAM: LUMBAR SPINE - COMPLETE 4+ VIEW COMPARISON:  Thoracic spine images 06/07/2015 FINDINGS: There are mild compression fractures involving the T12 and L1 vertebral bodies with approximately 30% loss of anterior vertebral body height. T12 appear stable since prior study. The compression fracture at L1 is stable or progressed since prior study. No additional acute bony abnormality. Degenerative facet disease in the lower lumbar spine. Diffuse aortoiliac atherosclerosis. Slight dilatation of the distal abdominal aorta which measures up to 3.2 cm. IMPRESSION: Mild compression fractures involving the T12 and L1 vertebral bodies. Mildly aneurysmal dilatation of the distal abdominal aorta, 3.2 cm by plain film. Electronically Signed   By: Rolm Baptise M.D.   On: 07/01/2015 13:31   Ct Head Wo Contrast  07/07/2015  CLINICAL DATA:  Fever.  Altered mental status.  Recent fall. EXAM: CT HEAD WITHOUT CONTRAST TECHNIQUE: Contiguous axial images were obtained from the base of the skull through the vertex without intravenous contrast. COMPARISON:  None. FINDINGS: Partially motion degraded study. Simple circumscribed 3.2 x 1.6 cm subcutaneous simple fluid density structure in the left inferior occipital scalp, consistent with a sebaceous cyst. No evidence of parenchymal hemorrhage or extra-axial fluid collection. No mass lesion, mass effect, or midline shift. No CT evidence of acute infarction. Intracranial atherosclerosis. Generalized cerebral volume loss. Nonspecific moderate subcortical and periventricular white matter hypodensity, most in keeping with chronic small vessel ischemic change. No ventriculomegaly. The  visualized paranasal sinuses are essentially clear. The  right mastoid air cells are unopacified. Near complete left mastoid effusion. No evidence of calvarial fracture. IMPRESSION: 1. No evidence of acute intracranial abnormality. No evidence of calvarial fracture. 2. Generalized cerebral volume loss and moderate chronic small vessel ischemia. 3. Near complete left mastoid effusion, probably inflammatory. Electronically Signed   By: Ilona Sorrel M.D.   On: 06/27/2015 13:26   US Renal  06/26/2015  CLINICAL DATA:  Acute renal failure EXAM: RENAL / URINARY TRACT ULTRASOUND COMPLETE COMPARISON:  None. FINDINGS: Right Kidney: Length: 12.0 cm. Multiple cysts are noted within the right kidney. There is a 2.7 cm cyst in the mid upper pole and a 18 2 point 5 cm cyst in the lower pole. Slight increased echogenicity is noted. No obstructive changes are noted. Left Kidney: Length: 14.8 cm. Multiple cysts are seen. The largest of these lies in the lower pole measuring 7 cm. A smaller 2.6 cm cyst is noted in the upper pole. Increased echogenicity is noted. No obstructive changes are noted. Bladder: Well distended. A a focal soft tissue lesion is identified in the inferior aspect of the bladder likely representing the prostate gland. The possibility of an underlying bladder mass could not be totally excluded however. IMPRESSION: Bilateral renal cystic change. Increased echogenicity consistent with medical renal disease. Likely prostate indenting on the inferior aspect of the bladder although the possibility of a bladder mass could not be totally excluded. Direct visualization is recommended. Electronically Signed   By: Inez Catalina M.D.   On: 06/26/2015 18:17   Dg Chest Port 1 View  06/29/2015  CLINICAL DATA:  Shortness of breath, community-acquired pneumonia, coronary artery and valvular heart disease status post CABG, lymphoma EXAM: PORTABLE CHEST 1 VIEW COMPARISON:  Portable chest x-ray of June 28, 2015. FINDINGS: The lungs are adequately inflated. Interstitial opacities have  worsened since the previous study. Areas of confluence have developed more fully. There is no pneumothorax or pleural effusion. The cardiac silhouette remains enlarged. The pulmonary vascularity is obscured. There are post median sternotomy changes. Thus calcification in the wall of the thoracic aorta. IMPRESSION: Worsening of airspace opacities bilaterally consistent with pulmonary edema or progressive pneumonia. Stable cardiomegaly. A small right pleural effusion is suspected. Electronically Signed   By: David  Martinique M.D.   On: 06/29/2015 07:29   Dg Chest Port 1 View  06/28/2015  CLINICAL DATA:  Shortness of breath EXAM: PORTABLE CHEST 1 VIEW COMPARISON:  07/08/2015; left rib radiographic series- 06/07/2015 FINDINGS: Grossly unchanged enlarged cardiac silhouette and mediastinal contours given AP projection and decreased lung volumes. Pulmonary vasculature is indistinct with cephalization of flow and development / progression of extensive perihilar heterogeneous airspace opacities. Trace bilateral effusions are suspected though suboptimally evaluated secondary exclusion of the bilateral hemidiaphragms. No pneumothorax. Old left-sided lateral rib fractures. No definite acute osseous abnormalities. IMPRESSION: Suspected worsening / development of alveolar pulmonary edema though note, underlying infection and/or aspiration is not excluded. Electronically Signed   By: Sandi Mariscal M.D.   On: 06/28/2015 09:05    Lab Data:  CBC:  Recent Labs Lab 07/09/2015 1330 06/26/15 0753 06/27/15 0655 06/28/15 0330 06/29/15 0426  WBC 12.5* 8.0 8.6 9.7 9.5  NEUTROABS 11.3* 6.8  --   --   --   HGB 9.2* 7.9* 7.7* 8.0* 7.5*  HCT 28.4* 24.7* 24.1* 24.9* 23.8*  MCV 93.7 92.2 90.6 91.9 90.2  PLT 224 167 166 207 [id number]   Basic Metabolic Panel:  Recent Labs Lab  06/26/15 0753 06/27/15 0655 06/28/15 0330 06/28/15 1532 06/29/15 1549  NA 139 139 138 137 141  K 5.2* 4.9 5.3* 5.2* 5.3*  CL 114* 112* 113* 109 110  CO2  17* 15* 15* 15* 17*  GLUCOSE 89 95 114* 115* 109*  BUN 65* 59* 58* 66* 78*  CREATININE 4.18* 4.03* 4.03* 4.47* 5.16*  CALCIUM 7.8* 8.0* 8.2* 8.0* 8.0*  MG  --   --  1.7  --   --   PHOS  --   --  4.4 5.2* 7.3*   GFR: Estimated Creatinine Clearance: 9.6 mL/min (by C-G formula based on Cr of 5.16). Liver Function Tests:  Recent Labs Lab 07/01/2015 1330 06/26/15 0753 06/28/15 0330 06/28/15 1532 06/29/15 1549  AST 22 22  --   --   --   ALT 17 18  --   --   --   ALKPHOS 45 41  --   --   --   BILITOT 1.0 0.9  --   --   --   PROT 5.8* 4.7*  --   --   --   ALBUMIN 2.8* 2.1* 2.2* 2.2* 2.1*   No results for input(s): LIPASE, AMYLASE in the last 168 hours. No results for input(s): AMMONIA in the last 168 hours. Coagulation Profile:  Recent Labs Lab 07/14/2015 1330 06/27/15 0655 06/28/15 0902 06/29/15 0426  INR 1.56* 1.85* 2.11* 2.43*   Cardiac Enzymes: No results for input(s): CKTOTAL, CKMB, CKMBINDEX, TROPONINI in the last 168 hours. BNP (last 3 results) No results for input(s): PROBNP in the last 8760 hours. HbA1C: No results for input(s): HGBA1C in the last 72 hours. CBG: No results for input(s): GLUCAP in the last 168 hours. Lipid Profile: No results for input(s): CHOL, HDL, LDLCALC, TRIG, CHOLHDL, LDLDIRECT in the last 72 hours. Thyroid Function Tests: No results for input(s): TSH, T4TOTAL, FREET4, T3FREE, THYROIDAB in the last 72 hours. Anemia Panel: No results for input(s): VITAMINB12, FOLATE, FERRITIN, TIBC, IRON, RETICCTPCT in the last 72 hours. Urine analysis:    Component Value Date/Time   COLORURINE YELLOW 06/29/2015 1255   APPEARANCEUR TURBID* 06/23/2015 1255   LABSPEC 1.014 07/07/2015 1255   PHURINE 8.0 06/29/2015 1255   GLUCOSEU NEGATIVE 06/24/2015 1255   HGBUR TRACE* 06/29/2015 [place] 07/07/2015 1255   KETONESUR NEGATIVE 07/20/2015 1255   PROTEINUR >300* 06/29/2015 1255   NITRITE NEGATIVE 07/17/2015 1255   LEUKOCYTESUR SMALL*  06/24/2015 1255     RAI,RIPUDEEP M.D. [facility] 2015/07/21, 10:40 AM  Pager: [phone] Between 7am to 7pm - call Pager - [phone]  After 7pm go to [email or web address] - password [email or web address]  Call night coverage person covering after 7pm

## 2015-07-21 NOTE — Progress Notes (Signed)
Daily Progress Note   Patient Name: George Noble       Date: 07/06/2015 DOB: 20-Apr-1924  Age: 82 y.o. MRN#: [id number] Attending Physician: Mendel Corning, MD Primary Care Physician: [name], MD Admit Date: 07/02/2015  Reason for Consultation/Follow-up: Establishing goals of care  Subjective: I met today with George Noble but he is in and out. I spoke more with his children [name], and [name]. We had a discussion about the direction of his care. He has had a terrible night with bad pain all night per RN. RN also gave him a sip of water at his request and he aspirated. He continues on HFNC although lungs have more air movement. Family at this point tell me that they do not want to prolong his suffering and even if his lungs and kidneys made a miraculous recovery he would be in pain and so debilitated this would not be QOL he would want. They desire to liberally treat his pain and suffering and to titrate off of HFNC. There was some discussion regarding taking him home but with his high oxygen requirements and pain requirements I am not sure this is reasonable to also maintain his comfort. If he stabilizes after transition to [place] and transition to comfort this can be revisited. Will initiate fentanyl infusion to better manage pain and shortness of breath (he is somewhat labored this morning).   Length of Stay: 5  Current Medications: Scheduled Meds:  . antiseptic oral rinse  7 mL Mouth Rinse BID  . furosemide  160 mg Intravenous Q8H  . ipratropium-albuterol  3 mL Nebulization Q6H  . lidocaine  2 patch Transdermal Q24H    Continuous Infusions: . fentaNYL infusion INTRAVENOUS      PRN Meds: acetaminophen **OR** acetaminophen, albuterol, antiseptic oral rinse, bisacodyl, fentaNYL, fentaNYL  (SUBLIMAZE) injection, glycopyrrolate **OR** glycopyrrolate **OR** glycopyrrolate, haloperidol lactate, ondansetron **OR** ondansetron (ZOFRAN) IV, polyvinyl alcohol  Physical Exam  Constitutional:  Pale  HENT:  Head: Normocephalic and atraumatic.  Cardiovascular: Normal rate.   Pulmonary/Chest: No accessory muscle usage. No tachypnea. No respiratory distress. He has decreased breath sounds in the right upper field, the right middle field, the right lower field, the left upper field, the left middle field and the left lower field.  Limited air movement heard. Fine crackles in bases  Abdominal:  Soft. Normal appearance.  Neurological:  Obtunded. Did not respond to stimulation.             Vital Signs: BP 133/76 mmHg  Pulse 96  Temp(Src) 98.2 F (36.8 C) (Axillary)  Resp 21  Ht 6' (1.829 m)  Wt 68.629 kg (151 lb 4.8 oz)  BMI 20.52 kg/m2  SpO2 100% SpO2: SpO2: 100 % O2 Device: O2 Device: High Flow Nasal Cannula O2 Flow Rate: O2 Flow Rate (L/min): 50 L/min  Intake/output summary:   Intake/Output Summary (Last 24 hours) at 2015/07/08 1134 Last data filed at 07/08/2015 0542  Gross per 24 hour  Intake      0 ml  Output   2100 ml  Net  -2100 ml   LBM: Last BM Date: 06/28/15 (per chart) Baseline Weight: Weight: 66.225 kg (146 lb) Most recent weight: Weight: 68.629 kg (151 lb 4.8 oz)       Palliative Assessment/Data: PPS: 10%     Patient Active Problem List   Diagnosis Date Noted  . Palliative care encounter   . SOB (shortness of breath)   . DNR (do not resuscitate)   . Acute respiratory failure with hypoxia ([place])   . Protein-calorie malnutrition, severe 06/27/2015  . Pressure ulcer 06/26/2015  . UTI (lower urinary tract infection) 07/19/2015  . CAP (community acquired pneumonia) 07/08/2015  . Acute encephalopathy 07/08/2015  . CAD (coronary artery disease), native coronary artery 06/24/2015  . Acute on chronic renal failure ([place]) 06/27/2015  . HTN (hypertension)  06/10/2015  . Lymphoma ([place]) 06/10/2015  . Anemia, iron deficiency 06/10/2015  . PCP NOTES >>>>>>>>>>>>>>>>>>>>> 06/10/2015  . Atrial fibrillation, permanent ([place])   . Hyperlipidemia   . Chronic kidney disease   . Seropositive rheumatoid arthritis of multiple sites [facility]) 11/20/2013    Palliative Care Assessment & Plan   Patient Profile: 82 yo male with history of coronary artery disease, A. fib on Anticoagulation, rheumatoid arthritis,arthritis, chronic kidney disease stage III (creatinine of 2.4 months back in [place], worsened to 3.4 one month back) chronic anemia, lymphoma, recent fall with T12-L1 compression fracture/rib fractures, hypertension, presented with SIRS with temperature 102F and acute encephalopathy in the setting of lobar pneumonia and UTI. On 6/8, worsening respiratory started secondary to pulmonary edema and pneumonia. Daughter George Noble recognized that he probably would not live long but he came from [place] to see his grandchildren graduate university and hopeful to attend other granddaughter's wedding in 4.5 weeks. George Noble was hoping that her father would continue to stay with her here in [place] for the rest of his life as he was independently living in [place]. She was beginning to establish him with providers in this area.   Assessment: George Noble is responding but breathing is moderately labored and he has had increased pain overnight.   Recommendations/Plan:  Pain/dyspnea: Fentanyl 25 mcg/hour (orders to titrate available with frequent bolus needs to max 200 mcg/hr) with fentanyl bolus 25-75 mcg every 15 min prn.   Delirium/agitation: Haldol 1 mg every 6 hours prn.   Goals of Care and Additional Recommendations:  Limitations on Scope of Treatment: Full comfort care.   Code Status:    Code Status Orders        Start     Ordered   06/29/15 0824  Do not attempt resuscitation (DNR)   Continuous    Question Answer Comment  In the event of cardiac or respiratory ARREST Do  not call a "code blue"   In the event of cardiac  or respiratory ARREST Do not perform Intubation, CPR, defibrillation or ACLS   In the event of cardiac or respiratory ARREST Use medication by any route, position, wound care, and other measures to relive pain and suffering. May use oxygen, suction and manual treatment of airway obstruction as needed for comfort.      06/29/15 0824    Code Status History    Date Active Date Inactive Code Status Order ID Comments User Context   07/13/2015 11:41 PM 06/29/2015  8:24 AM Partial Code [id number]  Toy Baker, MD Inpatient    Advance Directive Documentation        Most Recent Value   Type of Advance Directive  Healthcare Power of Attorney, Living will   Pre-existing out of facility DNR order (yellow form or pink MOST form)     "MOST" Form in Place?         Prognosis:   Hours - Days  Discharge Planning:  Anticipated Hospital Death  Care plan was discussed with Dr. Tana Coast and RN.   Thank you for allowing the Palliative Medicine Team to assist in the care of this patient.   Time In/Out: 1030-1130 Total Time 32mn Prolonged Time Billed  no       Greater than 50%  of this time was spent counseling and coordinating care related to the above assessment and plan.  PPershing Proud NP  Please contact Palliative Medicine Team phone at [phone] questions and concerns.

## 2015-07-21 NOTE — Progress Notes (Signed)
Per patient and family request George Noble was started on his Fentanyl  Drip @ 1545 after speaking with all his family and friends,  at 51 we changed him from a HFNC to a regular [place] at 6L per patient and family request. Patient tolerating well, Still speaking and interacting with family at bedside.  Will continue to monitor patient during comfort care .

## 2015-07-21 NOTE — Progress Notes (Signed)
S: Says he is hungry O:BP 133/76 mmHg  Pulse 96  Temp(Src) 98.2 F (36.8 C) (Axillary)  Resp 21  Ht 6' (1.829 m)  Wt 68.629 kg (151 lb 4.8 oz)  BMI 20.52 kg/m2  SpO2 93%  Intake/Output Summary (Last 24 hours) at 21-Jul-2015 0726 Last data filed at 2015-07-21 0542  Gross per 24 hour  Intake      0 ml  Output   2400 ml  Net  -2400 ml   Weight change:  Gen: Arousable.  Appears to be breathing comfortably CVS: irreg,irreg Resp:Bil crackles  Abd:+ BS ND Ext: no edema NEURO:  CNI  Ox2   . amiodarone  200 mg Oral Daily  . amLODipine  5 mg Oral Daily  . antiseptic oral rinse  7 mL Mouth Rinse BID  . aspirin EC  81 mg Oral Daily  . atorvastatin  20 mg Oral Daily  . bisacodyl  10 mg Rectal Daily  . calcitRIOL  0.25 mcg Oral Daily  . feeding supplement (NEPRO CARB STEADY)  237 mL Oral BID BM  . furosemide  160 mg Intravenous Q8H  . guaiFENesin  600 mg Oral BID  . ipratropium-albuterol  3 mL Nebulization Q6H  . isosorbide mononitrate  30 mg Oral Daily  . lidocaine  2 patch Transdermal Q24H  . metoprolol succinate  25 mg Oral Daily  . pantoprazole  40 mg Oral Daily  . piperacillin-tazobactam (ZOSYN)  IV  2.25 g Intravenous Q6H  . polyethylene glycol  17 g Oral BID  . predniSONE  10 mg Oral Q breakfast  . senna  2 tablet Oral QHS  . sodium bicarbonate  1,300 mg Oral BID  . tamsulosin  0.4 mg Oral Daily  . vancomycin  1,000 mg Intravenous Q48H  . warfarin  1 mg Oral ONCE-1800  . Warfarin - Pharmacist Dosing Inpatient   Does not apply q1800   Dg Chest Port 1 View  06/29/2015  CLINICAL DATA:  Shortness of breath, community-acquired pneumonia, coronary artery and valvular heart disease status post CABG, lymphoma EXAM: PORTABLE CHEST 1 VIEW COMPARISON:  Portable chest x-ray of June 28, 2015. FINDINGS: The lungs are adequately inflated. Interstitial opacities have worsened since the previous study. Areas of confluence have developed more fully. There is no pneumothorax or pleural effusion.  The cardiac silhouette remains enlarged. The pulmonary vascularity is obscured. There are post median sternotomy changes. Thus calcification in the wall of the thoracic aorta. IMPRESSION: Worsening of airspace opacities bilaterally consistent with pulmonary edema or progressive pneumonia. Stable cardiomegaly. A small right pleural effusion is suspected. Electronically Signed   By: David  Martinique M.D.   On: 06/29/2015 07:29   Dg Chest Port 1 View  06/28/2015  CLINICAL DATA:  Shortness of breath EXAM: PORTABLE CHEST 1 VIEW COMPARISON:  07/08/2015; left rib radiographic series- 06/07/2015 FINDINGS: Grossly unchanged enlarged cardiac silhouette and mediastinal contours given AP projection and decreased lung volumes. Pulmonary vasculature is indistinct with cephalization of flow and development / progression of extensive perihilar heterogeneous airspace opacities. Trace bilateral effusions are suspected though suboptimally evaluated secondary exclusion of the bilateral hemidiaphragms. No pneumothorax. Old left-sided lateral rib fractures. No definite acute osseous abnormalities. IMPRESSION: Suspected worsening / development of alveolar pulmonary edema though note, underlying infection and/or aspiration is not excluded. Electronically Signed   By: Sandi Mariscal M.D.   On: 06/28/2015 09:05   BMET    Component Value Date/Time   NA 141 06/29/2015 1549   NA 141 06/22/2015  K 5.3* 06/29/2015 1549   CL 110 06/29/2015 1549   CO2 17* 06/29/2015 1549   GLUCOSE 109* 06/29/2015 1549   BUN 78* 06/29/2015 1549   BUN 68* 06/22/2015   CREATININE 5.16* 06/29/2015 1549   CREATININE 3.8* 06/22/2015   CALCIUM 8.0* 06/29/2015 1549   GFRNONAA 9* 06/29/2015 1549   GFRAA 10* 06/29/2015 1549   CBC    Component Value Date/Time   WBC 9.5 06/29/2015 0426   WBC 7.6 05/10/2015   RBC 2.64* 06/29/2015 0426   RBC 2.68* 06/26/2015 0753   HGB 7.5* 06/29/2015 0426   HCT 23.8* 06/29/2015 0426   PLT 185 06/29/2015 0426   MCV 90.2  06/29/2015 0426   MCH 28.4 06/29/2015 0426   MCHC 31.5 06/29/2015 0426   RDW 14.9 06/29/2015 0426   LYMPHSABS 0.7 06/26/2015 0753   MONOABS 0.4 06/26/2015 0753   EOSABS 0.1 06/26/2015 0753   BASOSABS 0.0 06/26/2015 0753     Assessment: 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 Pulm edema +/- PNA,  3. 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 Met acidosis   Plan: 1.  Nurse tells me he is comfort care so labs not done but they will confer with family if this is the plan.  No family in room now.  He is making fair amt of urine and mentally is improved from yest.  If plan is to cont therapy with no escalation then would get labs.  Ahlivia Salahuddin T

## 2015-07-21 NOTE — Discharge Summary (Signed)
Expiration Note/ Death Summary  George Noble  MR#: [id number]  DOB:1925/01/14  Date of Admission: Jul 20, 2015 Date of Death: 25-Jul-2015 at 10:25pm  Attending Physician:RAI,RIPUDEEP  Patient's PCP: George Noble  Consults: Treatment Team:  Nephrology, Fleet Contras, Noble Palliative medicine  Cause of Death: Acute respiratory failure with hypoxia  Secondary Diagnoses . CAP (community acquired pneumonia) versus aspiration pneumonia . Pulmonary edema secondary to acute systolic and diastolic CHF . Recent fall with T12-L1 compression fracture  . UTI (lower urinary tract infection) . Anemia, iron deficiency . Atrial fibrillation, permanent ([place]) . sacral pressure ulcer  . Chronic kidney disease . HTN (hypertension) . Acute encephalopathy . CAD (coronary artery disease), native coronary artery . Acute on chronic renal failure ([place]) . Seropositive rheumatoid arthritis of multiple sites ([place])   Brief H and P: For complete details please refer to admission H and P, but in brief 82 year old male, recently moved from [place] in April 2017, staying with his daughter, with history of coronary artery disease, A. fib on Anticoagulation, rheumatoid arthritis,arthritis, chronic kidney disease stage III (creatinine of 2.4 months back in [place], worsened to 3.4 one month back) chronic anemia, lymphoma, recent fall with T12-L1 compression fracture/rib fractures, hypertension, presented with SIRS with temperature 102F and acute encephalopathy in the setting of lobar pneumonia and UTI. On 6/8, worsening respiratory started secondary to pulmonary edema and pneumonia. Discussed extensively with patient's daughter George Noble, clarified goals of care and transferred to stepdown unit on BiPAP (no CPR or intubation).  Goals of care on 6/9, patient was made comfort care status per family's wishes due to worsening dyspnea and respiratory distress.  Hospital Course: Acute respiratory failure  with hypoxia secondary to pneumonia and pulmonary edema -Patient was aggressively managed for the respiratory failure, was transferred down to stepdown unit with plans for BiPAP. However on night of 6/8, patient was noted to have worsening dyspnea, agitation and would not allow BiPAP. - On 6/9, palliative medicine and attending physician, [name] discussed in detail with the patient's family and patient was placed on comfort care status goals. Patient however was continued on all his antibiotics and diuretics. Patient did have some more air movement in his lungs however remained on high flow nasal cannula and was not able to wean down O2. Family at this point did not want to prolong his suffering and even if his lungs or renal function did improve however he would be in pain secondary to the compression fractures and debilitated which would affect his quality of life. Family at this point requested complete comfort care status and let nature take its course.   Community-acquired pneumonia/? Aspiration pneumonia - Patient was initially started on IV Rocephin and azithromycin which were broadened to vancomycin and Zosyn on 6/8. However Chest x-ray showed progressive worsening.  Acute systolic and diastolic CHF - Patient was followed closely by nephrology and Lasix was increased to help with pulmonary edema and comfort. 2-D echo had shown EF of 25-30% with grade 1 diastolic dysfunction, moderate mitral regurgitation, PA pressure 43  Acute encephalopathy - Secondary to acute medical illness.  Acute on chronic kidney disease stage III- IV/mild hyperkalemia/metabolic acidosis, Creatinine of 2.4 from 11/2014 in [place] and more recent creatinine has ranged 3.5-3.7. Creatinine however worsened up to 5.16 on 6/9. Renal ultrasound showed polycystic kidneys with evidence of medical renal disease On sodium bicarbonate As per daughter, patient had seen nephrology/[name] recently as outpatient and  determined not a candidate for hemodialysis.  Atrial fibrillation, permanent (George Noble), CHADS2VASC 4,  Essential hypertension,  CAD Comfort care status  Iron deficiency anemia and anemia of chronic renal disease, folate and B12 wnl. Iron level low, ferritin elevated from infection and RA - Patient was supposed to see [name] as outpatient.  Sacral pressure ulcer Patient was followed by wound care   BPH with evidence of urinary retention on RUS. -Patient was placed on Flomax while inpatient  Recent fall with T12-L1 compression fracture Seropositive RA   Patient passed on 07-17-15 at 10:25PM  Signed:  RAI,RIPUDEEP M.D. [facility] 07/01/2015, 6:15 AM Pager: [phone]

## 2015-07-21 NOTE — Progress Notes (Signed)
Called into patient's room by patient's daughter. Daughter stating that patient had stopped breathing and she believes he has passed. This RN and charge RN Sharee Pimple Tsoutis listened for patient's heart beat for one minute each; patient was pronounced dead at 10-May-2223. CDS notified; patient placement notified; ME notified. All personal belongings were taken by patient's family. Patient was transported to the morgue.   Patient had been receiving a fentanyl drip for comfort and pain control. After patient's passing, this RN and Sharee Pimple Tsoutis RN wasted 125 mL of fentanyl in the sink.

## 2015-07-21 DEATH — deceased

## 2017-05-20 IMAGING — CT CT HEAD W/O CM
3 series · 15 of 47 positions shown, 18 images · non-contrast
Comparison: None.

CLINICAL DATA: Fever.  Altered mental status.  Recent fall.

EXAM:
CT HEAD WITHOUT CONTRAST
TECHNIQUE: Contiguous axial images were obtained from the base of the skull
through the vertex without intravenous contrast.

[Series 3: head wo · axial · 0.44mm/px · z∈[-10,+115]mm · 9 of 31 slices shown, 12 images]
[im 3/31  brain]
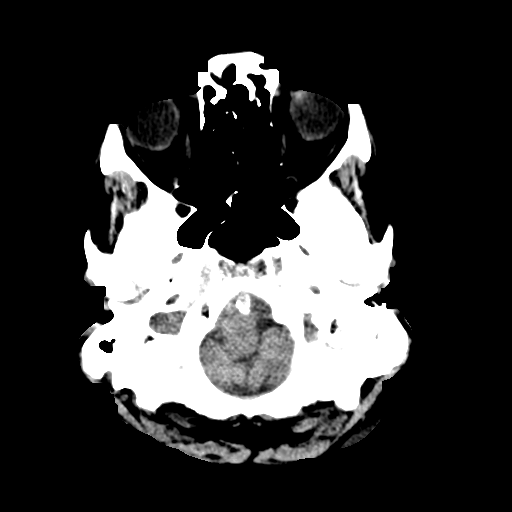
[im 3/31  bone]
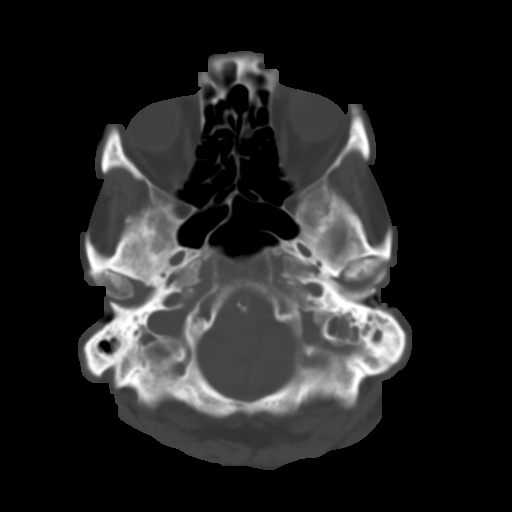
[im 6/31  brain]
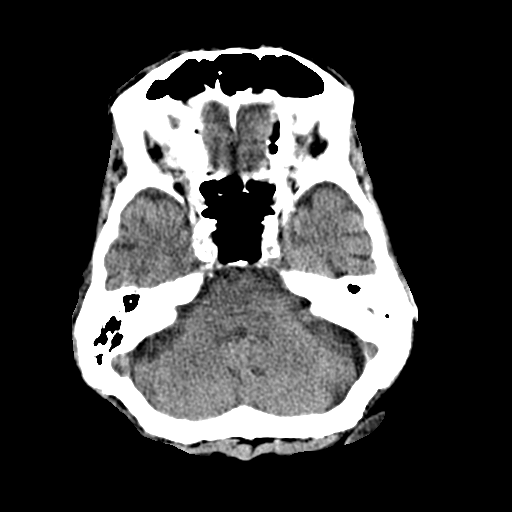
[im 9/31  brain]
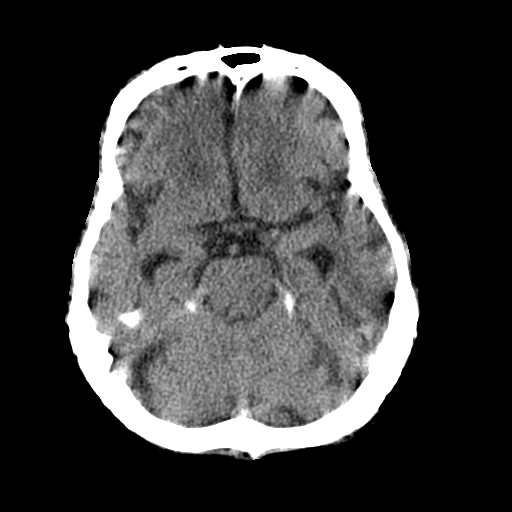
[im 12/31  brain]
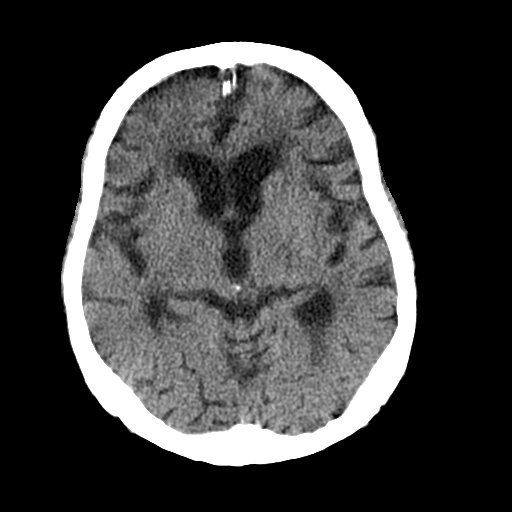
[im 16/31  brain]
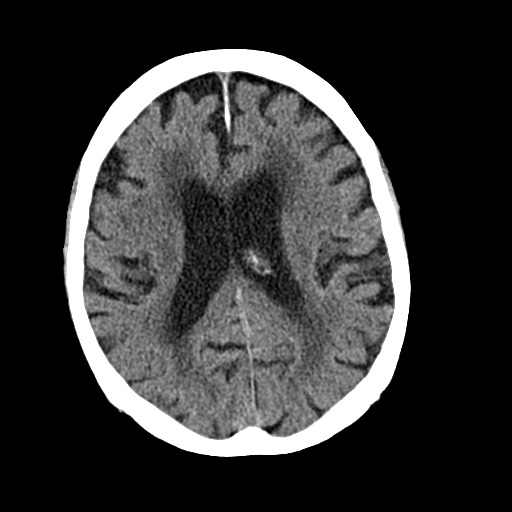
[im 16/31  bone]
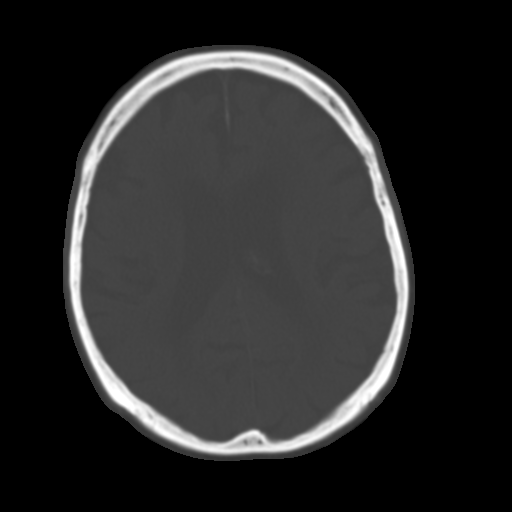
[im 19/31  brain]
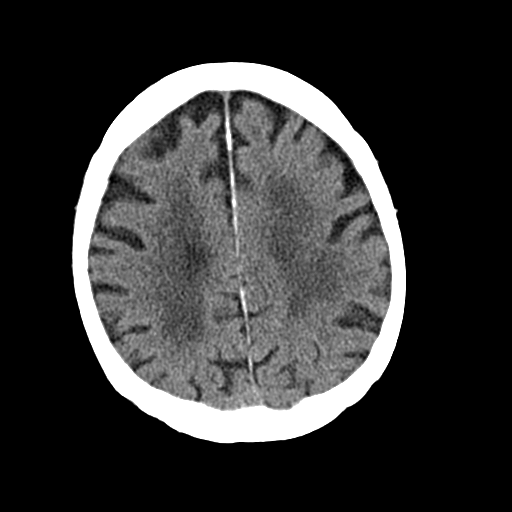
[im 22/31  brain]
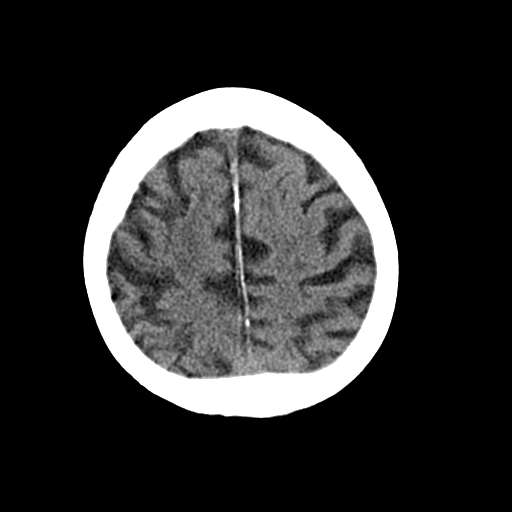
[im 25/31  brain]
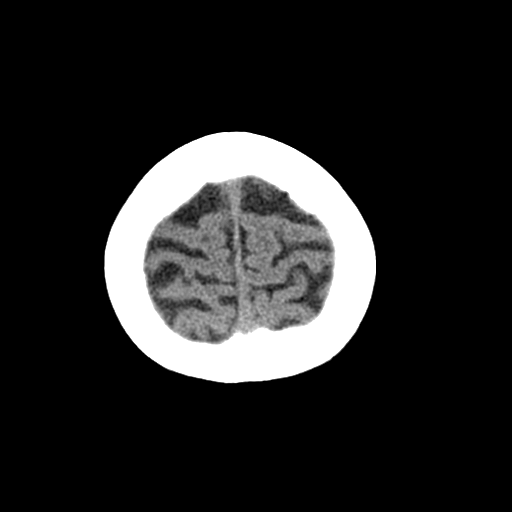
[im 28/31  brain]
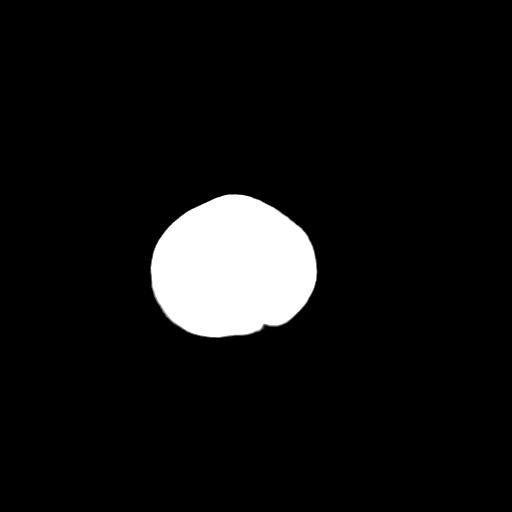
[im 28/31  bone]
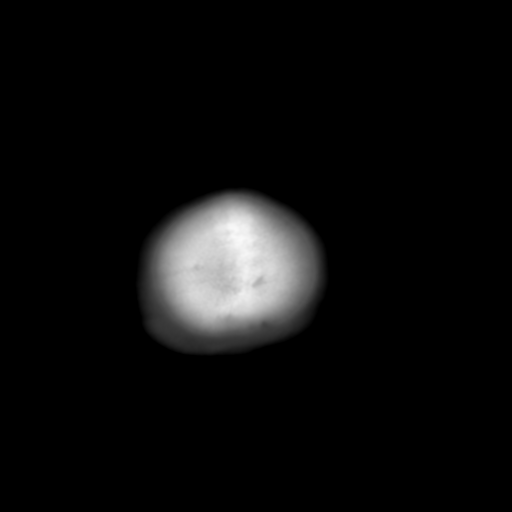

[Series 5: cor soft · coronal · 0.31mm/px · 3 of 67 slices shown]
[im 23/67  brain]
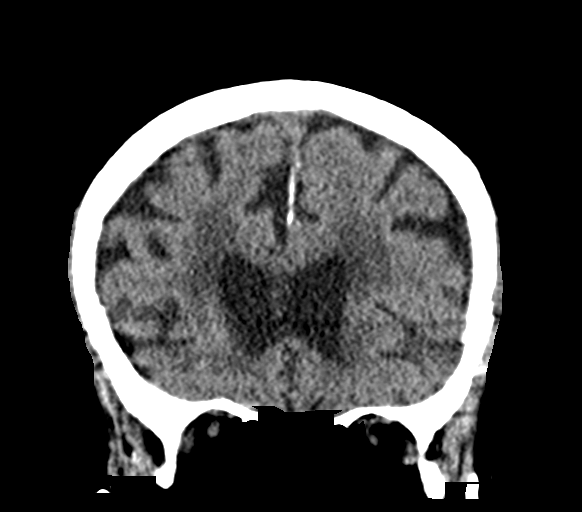
[im 30/67  brain]
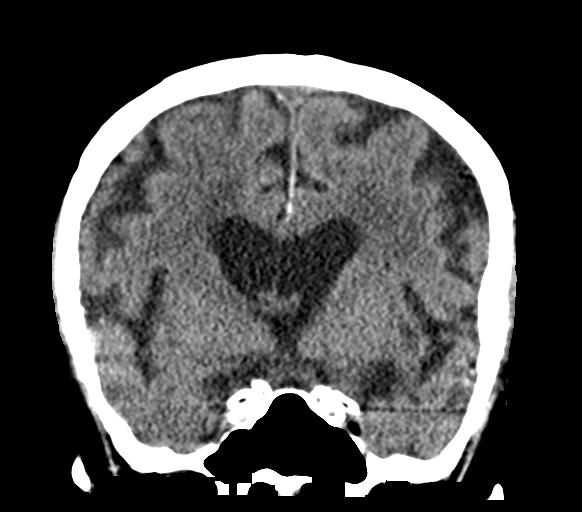
[im 37/67  brain]
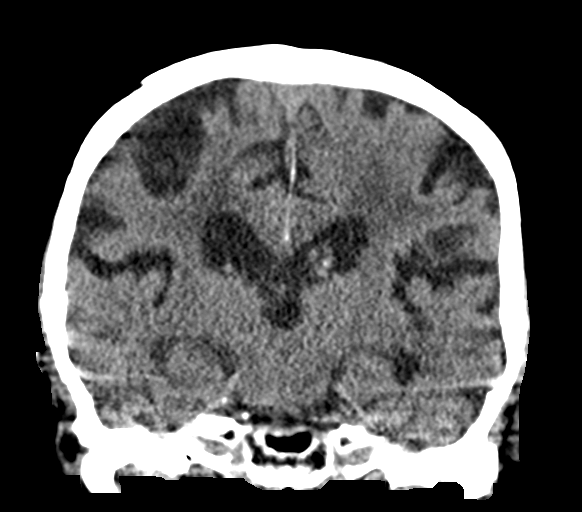

[Series 6: sag soft · sagittal · 0.32mm/px · 3 of 60 slices shown]
[im 20/60  brain]
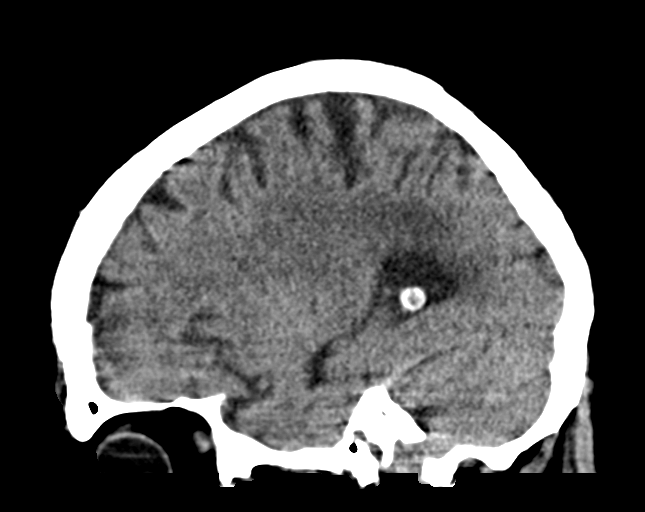
[im 30/60  brain]
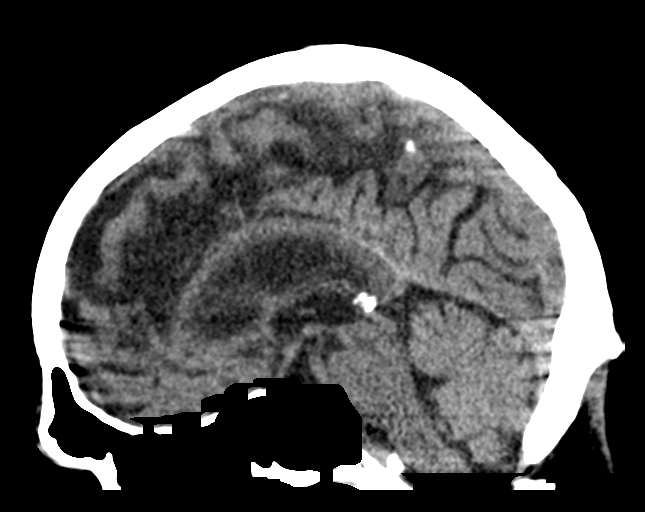
[im 40/60  brain]
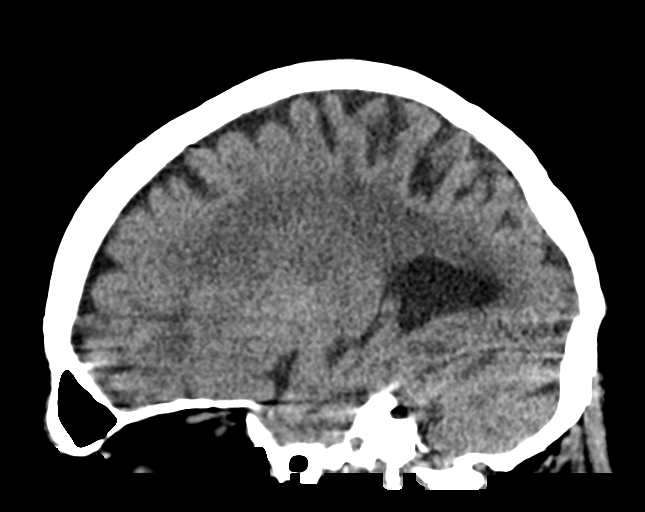

[15 of 47 positions shown; findings below may reference images not displayed]

FINDINGS: Partially motion degraded study.

Simple circumscribed 3.2 x 1.6 cm subcutaneous simple fluid density
structure in the left inferior occipital scalp, consistent with a
sebaceous cyst. No evidence of parenchymal hemorrhage or extra-axial
fluid collection. No mass lesion, mass effect, or midline shift.

No CT evidence of acute infarction. Intracranial atherosclerosis.

Generalized cerebral volume loss. Nonspecific moderate subcortical
and periventricular white matter hypodensity, most in keeping with
chronic small vessel ischemic change. No ventriculomegaly.

The visualized paranasal sinuses are essentially clear. The right
mastoid air cells are unopacified. Near complete left mastoid
effusion. No evidence of calvarial fracture.
IMPRESSION: 1. No evidence of acute intracranial abnormality. No evidence of
calvarial fracture.
2. Generalized cerebral volume loss and moderate chronic small
vessel ischemia.
3. Near complete left mastoid effusion, probably inflammatory.

## 2017-05-23 IMAGING — DX DG CHEST 1V PORT
2 series · 2 of 2 positions shown · non-contrast
Comparison: 06/25/2015; left rib radiographic series- 06/07/2015

CLINICAL DATA: Shortness of breath

EXAM:
PORTABLE CHEST 1 VIEW

[chest ap (1 of 2)]
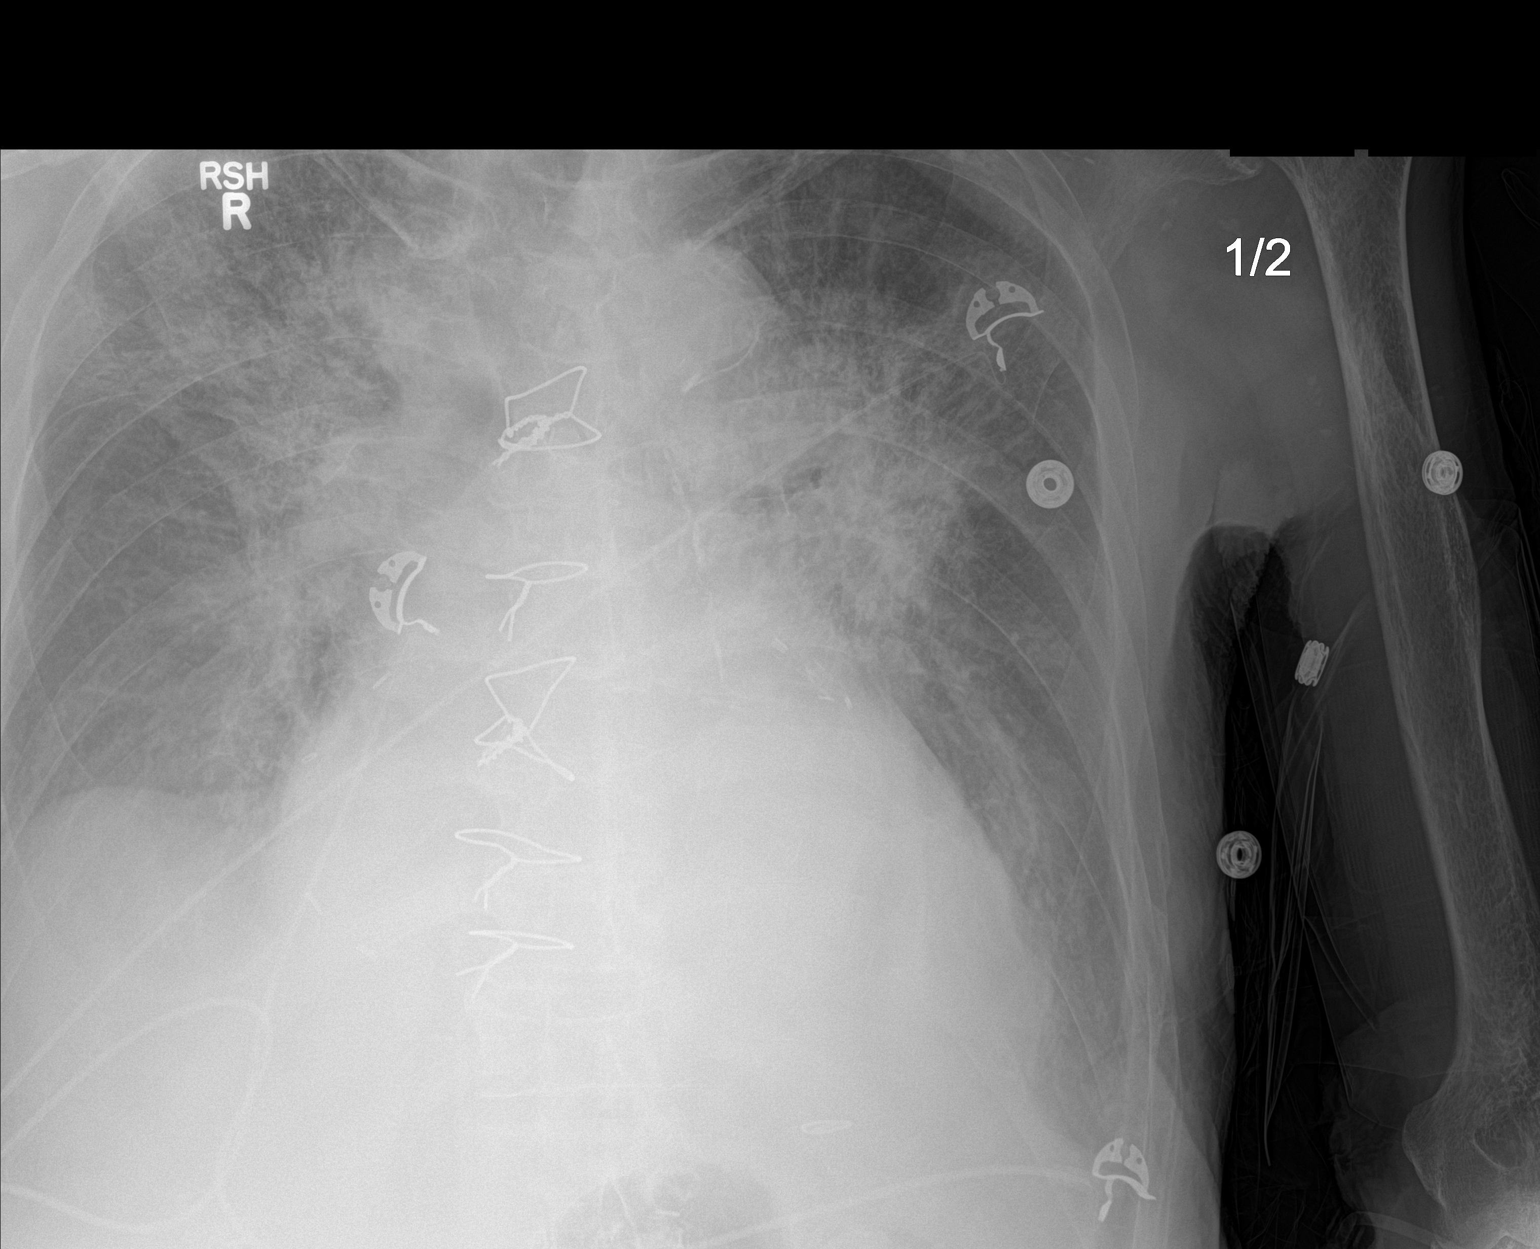

[chest ap (2 of 2)]
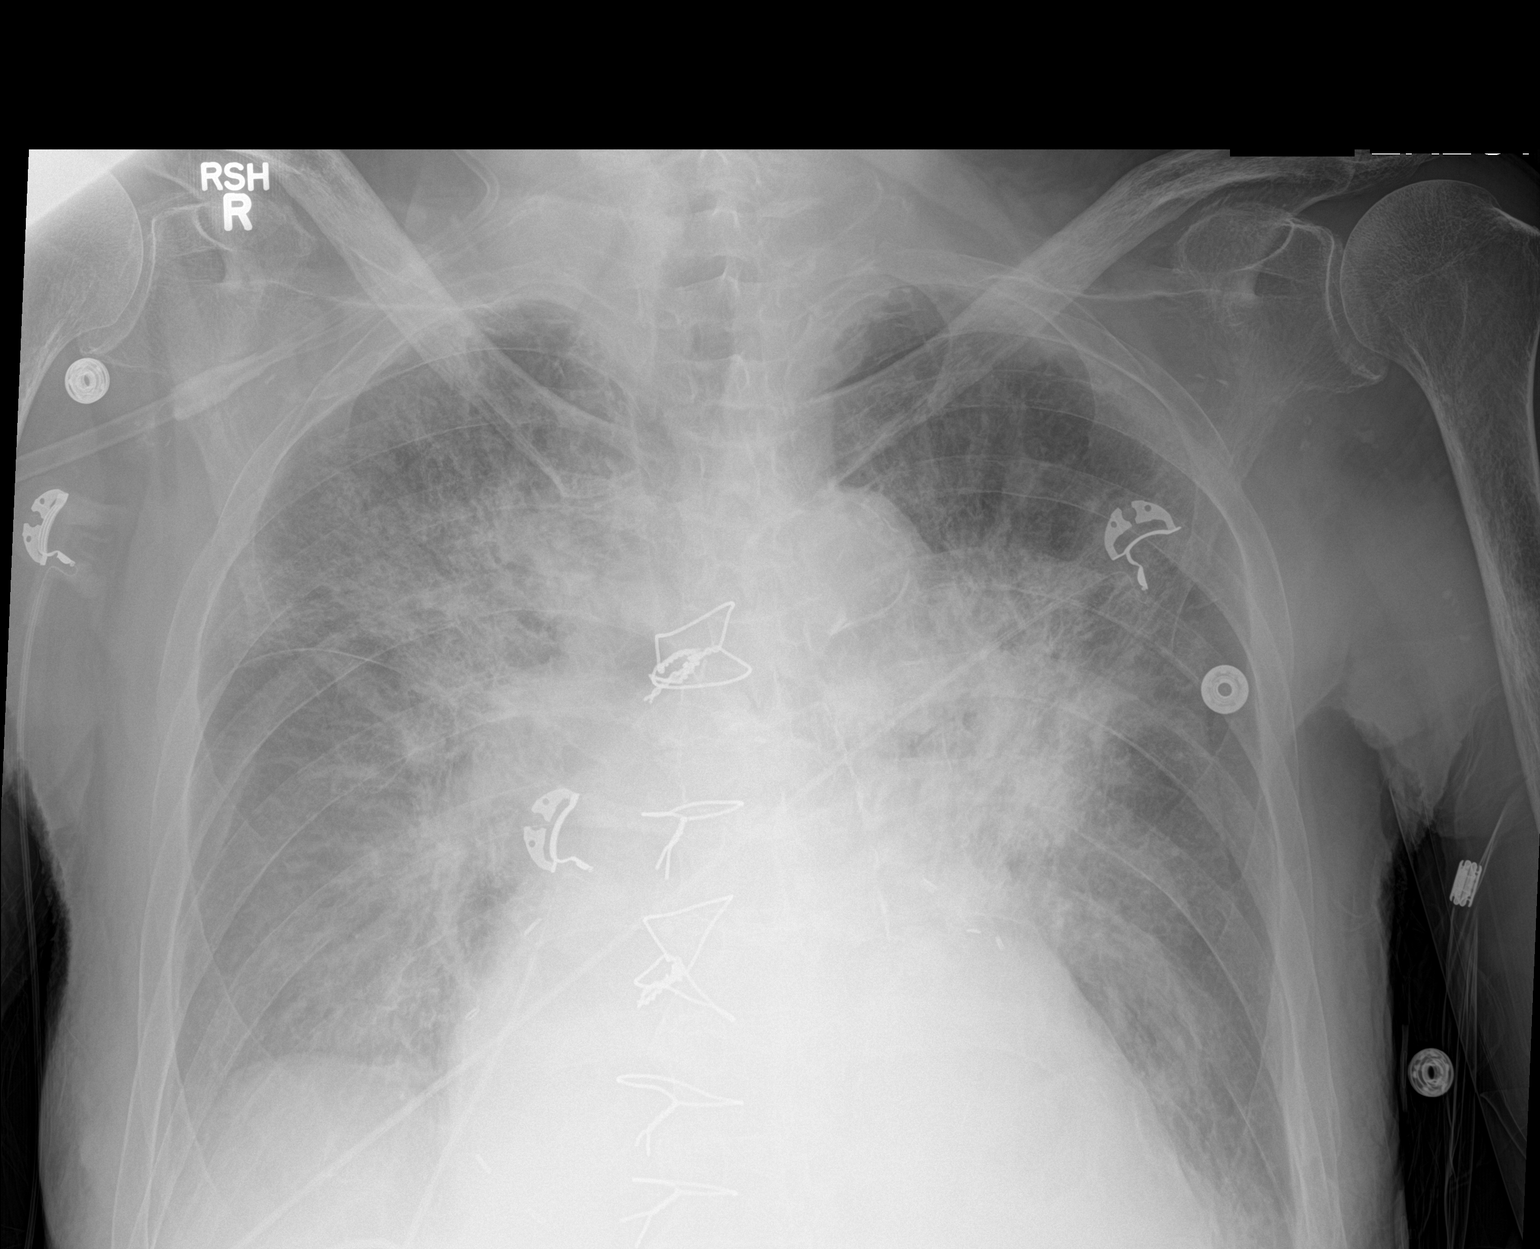

[2 of 2 positions shown; findings below may reference images not displayed]

FINDINGS: Grossly unchanged enlarged cardiac silhouette and mediastinal
contours given AP projection and decreased lung volumes. Pulmonary
vasculature is indistinct with cephalization of flow and development
/ progression of extensive perihilar heterogeneous airspace
opacities.

Trace bilateral effusions are suspected though suboptimally
evaluated secondary exclusion of the bilateral hemidiaphragms. No
pneumothorax.

Old left-sided lateral rib fractures. No definite acute osseous
abnormalities.
IMPRESSION: Suspected worsening / development of alveolar pulmonary edema though
note, underlying infection and/or aspiration is not excluded.

## 2017-05-24 IMAGING — DX DG CHEST 1V PORT
1 series · 1 of 1 positions shown · non-contrast
Comparison: Portable chest x-ray June 28, 2015.

CLINICAL DATA: Shortness of breath, community-acquired pneumonia,
coronary artery and valvular heart disease status post CABG,
lymphoma

EXAM:
PORTABLE CHEST 1 VIEW

[chest ap]
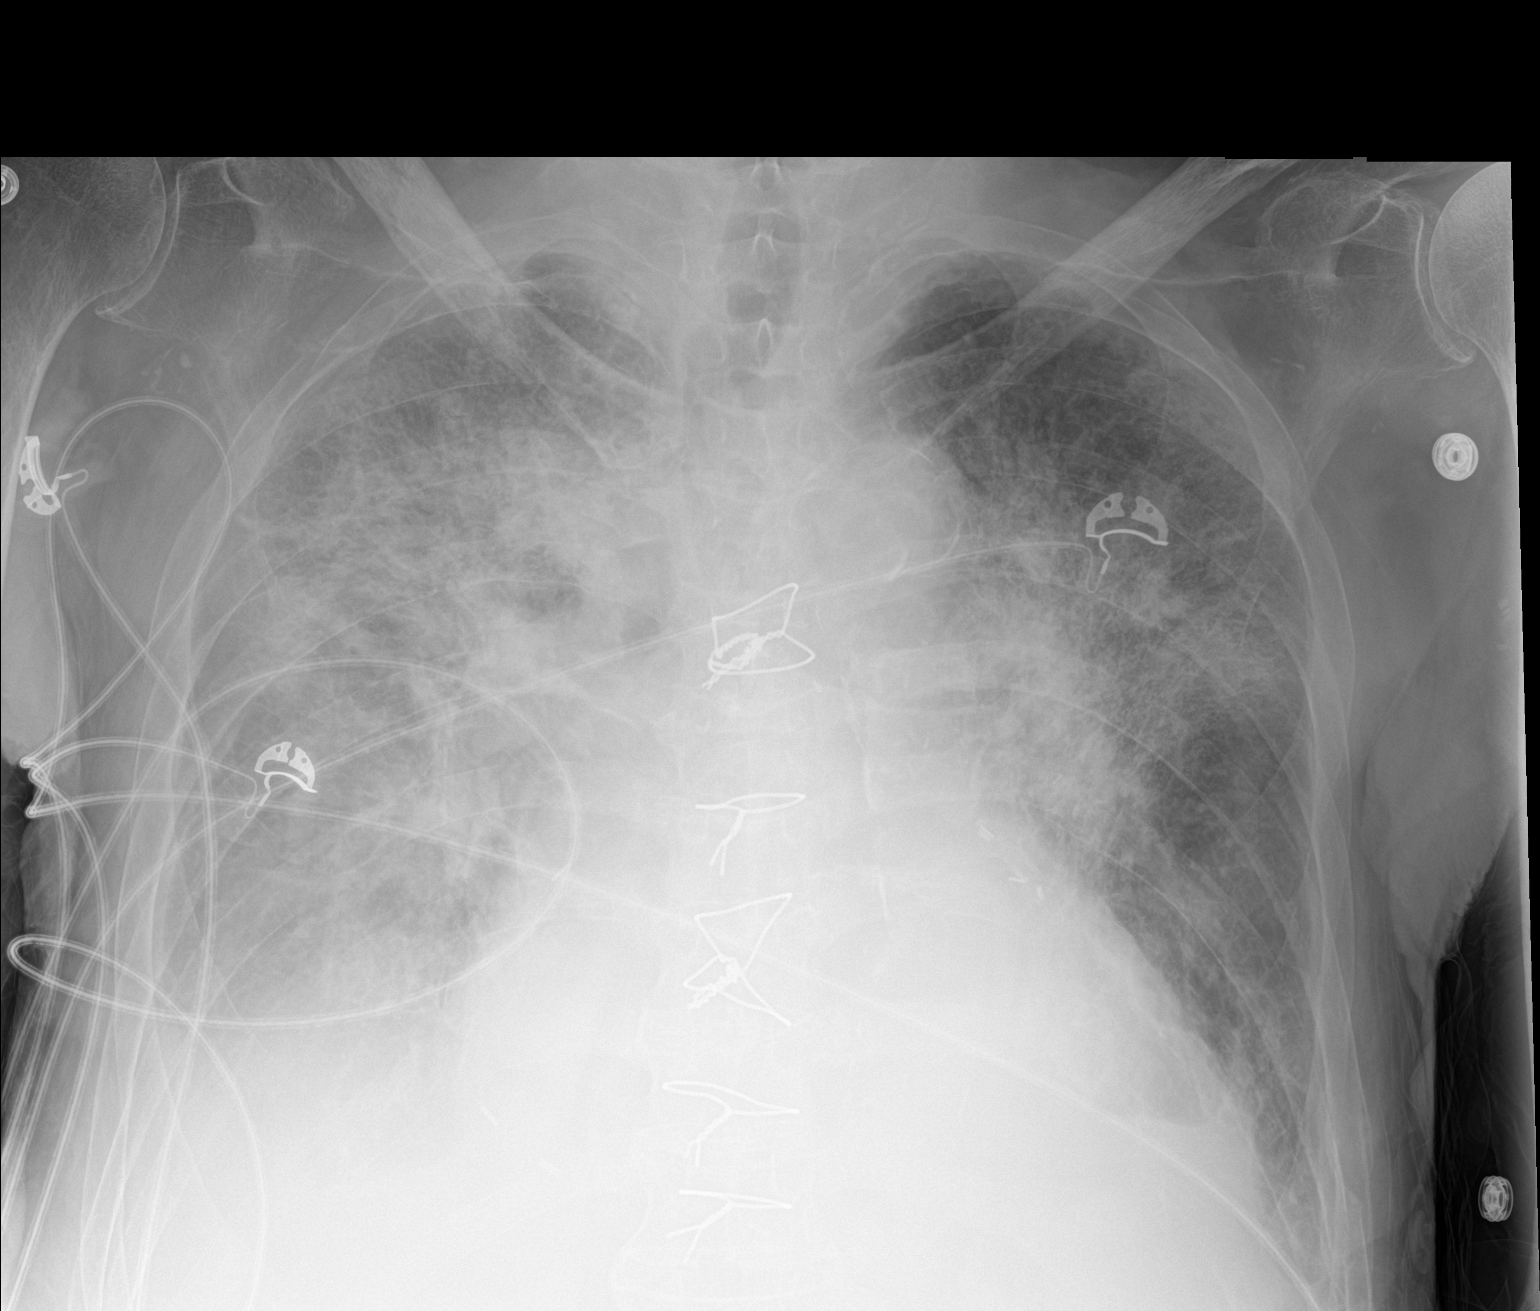

[1 of 1 positions shown; findings below may reference images not displayed]

FINDINGS: The lungs are adequately inflated. Interstitial opacities have
worsened since the previous study. Areas of confluence have
developed more fully. There is no pneumothorax or pleural effusion.
The cardiac silhouette remains enlarged. The pulmonary vascularity
is obscured. There are post median sternotomy changes. Thus
calcification in the wall of the thoracic aorta.
IMPRESSION: Worsening of airspace opacities bilaterally consistent with
pulmonary edema or progressive pneumonia. Stable cardiomegaly. A
small right pleural effusion is suspected.
